# Patient Record
Sex: Female | Born: 1960 | Race: White | Hispanic: No | Marital: Married | Smoking: Former smoker
Health system: Southern US, Community
[De-identification: ages and names within clinical notes are randomized; demographics above are authoritative.]

## PROBLEM LIST (undated history)

## (undated) DIAGNOSIS — I10 Essential (primary) hypertension: Secondary | ICD-10-CM

## (undated) HISTORY — PX: WRIST SURGERY: SHX841

## (undated) HISTORY — PX: DILATION AND CURETTAGE OF UTERUS: SHX78

## (undated) HISTORY — PX: TONSILLECTOMY: SUR1361

---

## 1997-11-21 ENCOUNTER — Other Ambulatory Visit: Admission: RE | Admit: 1997-11-21 | Discharge: 1997-11-21 | Payer: Self-pay | Admitting: Obstetrics and Gynecology

## 1998-02-07 ENCOUNTER — Encounter: Admission: RE | Admit: 1998-02-07 | Discharge: 1998-05-08 | Payer: Self-pay | Admitting: Gynecology

## 1998-06-08 ENCOUNTER — Encounter: Payer: Self-pay | Admitting: Obstetrics and Gynecology

## 1998-06-08 ENCOUNTER — Ambulatory Visit (HOSPITAL_COMMUNITY): Admission: RE | Admit: 1998-06-08 | Discharge: 1998-06-08 | Payer: Self-pay | Admitting: Obstetrics and Gynecology

## 1998-07-03 ENCOUNTER — Inpatient Hospital Stay (HOSPITAL_COMMUNITY): Admission: AD | Admit: 1998-07-03 | Discharge: 1998-07-05 | Payer: Self-pay | Admitting: Gynecology

## 1999-09-15 ENCOUNTER — Emergency Department (HOSPITAL_COMMUNITY): Admission: EM | Admit: 1999-09-15 | Discharge: 1999-09-15 | Payer: Self-pay | Admitting: Emergency Medicine

## 1999-09-15 ENCOUNTER — Encounter: Payer: Self-pay | Admitting: Emergency Medicine

## 1999-09-15 ENCOUNTER — Encounter: Payer: Self-pay | Admitting: Specialist

## 1999-09-18 ENCOUNTER — Encounter: Payer: Self-pay | Admitting: Orthopedic Surgery

## 1999-09-18 ENCOUNTER — Encounter: Admission: RE | Admit: 1999-09-18 | Discharge: 1999-09-18 | Payer: Self-pay | Admitting: Orthopedic Surgery

## 1999-09-24 ENCOUNTER — Encounter: Payer: Self-pay | Admitting: Orthopedic Surgery

## 1999-09-24 ENCOUNTER — Observation Stay (HOSPITAL_COMMUNITY): Admission: RE | Admit: 1999-09-24 | Discharge: 1999-09-25 | Payer: Self-pay | Admitting: Orthopedic Surgery

## 2000-01-30 ENCOUNTER — Encounter: Payer: Self-pay | Admitting: Family Medicine

## 2000-01-30 ENCOUNTER — Encounter: Admission: RE | Admit: 2000-01-30 | Discharge: 2000-01-30 | Payer: Self-pay | Admitting: Family Medicine

## 2002-06-30 ENCOUNTER — Ambulatory Visit (HOSPITAL_COMMUNITY): Admission: RE | Admit: 2002-06-30 | Discharge: 2002-06-30 | Payer: Self-pay | Admitting: *Deleted

## 2002-06-30 ENCOUNTER — Encounter: Payer: Self-pay | Admitting: *Deleted

## 2002-11-03 ENCOUNTER — Encounter: Payer: Self-pay | Admitting: *Deleted

## 2002-11-03 ENCOUNTER — Ambulatory Visit (HOSPITAL_COMMUNITY): Admission: RE | Admit: 2002-11-03 | Discharge: 2002-11-03 | Payer: Self-pay | Admitting: *Deleted

## 2003-08-04 ENCOUNTER — Emergency Department (HOSPITAL_COMMUNITY): Admission: EM | Admit: 2003-08-04 | Discharge: 2003-08-04 | Payer: Self-pay | Admitting: Family Medicine

## 2004-06-01 ENCOUNTER — Encounter: Admission: RE | Admit: 2004-06-01 | Discharge: 2004-06-01 | Payer: Self-pay | Admitting: Family Medicine

## 2004-10-24 ENCOUNTER — Encounter: Admission: RE | Admit: 2004-10-24 | Discharge: 2004-10-24 | Payer: Self-pay | Admitting: *Deleted

## 2007-07-04 ENCOUNTER — Inpatient Hospital Stay: Payer: Self-pay | Admitting: Unknown Physician Specialty

## 2008-08-11 ENCOUNTER — Encounter: Admission: RE | Admit: 2008-08-11 | Discharge: 2008-08-11 | Payer: Self-pay | Admitting: Family Medicine

## 2010-03-16 ENCOUNTER — Encounter: Payer: Self-pay | Admitting: Internal Medicine

## 2010-03-17 ENCOUNTER — Encounter: Payer: Self-pay | Admitting: Family Medicine

## 2010-03-17 ENCOUNTER — Encounter: Payer: Self-pay | Admitting: *Deleted

## 2010-07-12 NOTE — Op Note (Signed)
[facility]  Patient:    Joanna Mcintosh, Joanna Mcintosh                        MRN: [id number] Proc. Date: 09/24/99 Adm. Date:  [id number] Disc. Date: [id number] Attending:  Dominica Severin CC:         Javier Docker, M.D.   Operative Report  DATE OF BIRTH:  02-09-1961  PREOPERATIVE DIAGNOSIS:  Right displaced distal radius fracture with large volar spike protruding into the carpal canal region.  POSTOPERATIVE DIAGNOSIS:  Right displaced distal radius fracture with large volar spike protruding into the carpal canal region.  PROCEDURE:  Open reduction and internal fixation of right distal radius fracture with a titanium volar plate and screws with dorsal blocking pegs.  SURGEON:  Elisha Ponder, M.D.  ASSISTANT:  Ralene Bathe, P.A.  COMPLICATIONS:  None.  ANESTHESIA:  General.  ESTIMATED BLOOD LOSS:  Minimal.  TOURNIQUET TIME:  Less than 90 minutes.  DRAINS:  One small ______ was placed.  COMPLICATIONS:  None.  INDICATIONS FOR PROCEDURE:  The patient is a very pleasant 50 year old white female presenting to the emergency room with a grossly displaced right radius fracture.  She underwent provisional reduction by Javier Docker, M.D.  This was a closed reduction.  The patient did have some volar comminution and a large volar spike was noted.  The patients radius did have some settling as well.  However, the volar tilt had been reduced nicely.  I obtained a CT scan of her wrist, and subsequently counseled her in regards to the findings.  The CT scan had shown some interval collapse and settling of the fracture site with loss of radial inclination and a large protruding volar spike into the carpal canal region.  The patient had no significant signs of median nerve compression at present time, but did initially have some occasional tingling in the immediate post-reduction period.  Her initial degree of displacement was significant given the large  volar spike, settling, and loss radial inclination, I discussed with her the risks and benefits of operative intervention including risks of infection, bleeding, anesthesia, damage to normal structures, failure of the surgery to accomplish its intended goals of relieving symptoms and restoring function.  I have discussed with her that surgery would attempt to recreate the volar buttress and reduce the volar spike.  I discussed with her the possible need for a dorsal incision and bone grafting.  I discussed with her goals of maintaining the radial inclination, radial height, and volar inclination.  We had a long discussion about this and she desired to proceed with surgery.  All risks and benefits were discussed.  INTRAOPERATIVE FINDINGS:  The patient had displaced right distal radius fracture with large volar spike protruding into the carpal canal region.  The patient underwent reduction and placement of a volar plate with derotational pegs from [employer].  The patient had excellent recreation of her anatomy to my satisfaction without difficulty.  She had a stable range of motion under fluoroscopy and no tendency towards dorsal collapse of the fracture site.  The derotational pegs held her fracture position excellently.  DESCRIPTION OF PROCEDURE:  The patient was seen by myself and anesthesia preoperatively.  She was then taken to the operative suite and underwent preoperative antibiotic administration, placed supine, appropriately prepped and draped in the usual sterile fashion after administration of general endotracheal anesthesia.  The right upper extremity was held in traction, prepped  and draped with Betadine scrub, following painting and draping.  Once this was done, outline marks were made for a volar approach to the wrist.  The patient had the tourniquet inflated to 250 mmHg and a longitudinal incision, approximately 6-7 cm long was made over the course of the FCR tendon.   The patient had the FCR sheath exposed.  Cautery was used for hemostasis.  The patient had dissection carried to the level of the superficial radial artery and once this was done, the tendon was retracted after the sheath was incised.  The floor of the FCR tendon sheath was then incised and retractors were placed appropriately.  The patient had the floor of the FCR sheath released to the level of the distal tuberosity of the scaphoid.  Following this, a plane was developed between the FPL and the radial septum to reach the surface of the radius.  The patient had multiple muscular perforators which were cauterized as necessary.  Following this, a plane was developed between the subtendinous space of Parona and the pronator quadratus was exposed.  Following this, the pronator was released with an L-shaped incision and lifted from its bed in a radial-to-ulnar direction.  This allowed exposure of the fracture site.  The patient had the FPL muscle partially released, _______ exposure as necessary and retracted out of harms way.  The patient had the fracture then identified.  There was a large volar spike protruding into the carpal canal-type region.  This spike was gently curettaged and mobilized. Distal to this spike was then main body of her fracture.  The patient underwent meticulous and tedious dissection, and recreation of the anatomy. The volar buttress was restored.  Following restoration of the floor of the radius, provisional fixation was achieved.  A DVR plate from [employer] was then placed.  This was placed volarly and checked under x-ray.  Following this, the patient underwent placement of initial screws in the gliding hole which were 3.5 cortical screws.  This was placed through the gliding hole to allow for adjustment of the volar wrist plate.  The screw was placed without difficulty.  Following this, reduction was confirmed under radiographs, and the patient then had  the distal fourth holes in the plate filled.  This was filled with  three pegs screwed into the distal plate and one 2.5 mm screw.  The plate allowed for nice angulation into the distal fracture fragment, and held in nicely in a reduced position.  There was no dorsal angulation and I was very happy with the overall volar inclination, radial length, and radial inclination.  Pegs and screws were placed in the standard technique of drilling following by depth gauze measurement and then placement.  These engaged the plate nicely.  Following this, the plate had 3.5 cortical screws x 3 placed in the remaining holes.  Once this was done, the patient had the construct checked under x-ray. This was stable.  The patient had excellent range of motion and no tendency towards collapse or displacement.  The patient then had the tourniquet deflated and hemostasis was obtained.  The pronator quadratus was reattached with 2-0 Vicryl to its original position without difficulty.  I was happy with the placement of the pronator quadratus as it covered the plate nicely, and there was no tendency towards impingement against the volar tendinous structures.  Following this, a ____ drain was placed and the wound was then sutured with Prolene in the skin edge.  The patient tolerated this well  without difficulty. She had excellent radial pulse, no excessive bleeding, and excellent capillary refill in all the fingers.  The patient had Xeroform gauze, Kerlix, Webril, and sugar-tong splint applied without difficulty.  Following application, she was extubated and taken to the recovery room in stable condition.  She will be monitored in the recovery room and kept overnight for observation.  I was happy with her reduction.  The pegs were placed below the subchondral plate and held the distal fragment into excellent position.  I do think she will be a good candidate for early range of motion protocol.  I have discussed  all findings with the family. DD:  09/24/99 TD:  09/26/99 Job: [id number]

## 2010-09-17 ENCOUNTER — Other Ambulatory Visit: Payer: Self-pay | Admitting: Obstetrics and Gynecology

## 2010-09-17 ENCOUNTER — Other Ambulatory Visit (HOSPITAL_COMMUNITY)
Admission: RE | Admit: 2010-09-17 | Discharge: 2010-09-17 | Disposition: A | Discharge status: Home / Self Care | Payer: 59 | Source: Ambulatory Visit | Attending: Obstetrics and Gynecology | Admitting: Obstetrics and Gynecology

## 2010-09-17 DIAGNOSIS — Z01419 Encounter for gynecological examination (general) (routine) without abnormal findings: Secondary | ICD-10-CM | POA: Insufficient documentation

## 2011-01-21 ENCOUNTER — Encounter: Payer: Self-pay | Admitting: Emergency Medicine

## 2011-01-21 ENCOUNTER — Emergency Department (HOSPITAL_COMMUNITY): Payer: 59

## 2011-01-21 ENCOUNTER — Emergency Department (HOSPITAL_COMMUNITY)
Admission: EM | Admit: 2011-01-21 | Discharge: 2011-01-21 | Disposition: A | Discharge status: Home / Self Care | Payer: 59 | Attending: Emergency Medicine | Admitting: Emergency Medicine

## 2011-01-21 DIAGNOSIS — R10816 Epigastric abdominal tenderness: Secondary | ICD-10-CM | POA: Insufficient documentation

## 2011-01-21 DIAGNOSIS — R1013 Epigastric pain: Secondary | ICD-10-CM | POA: Insufficient documentation

## 2011-01-21 DIAGNOSIS — R197 Diarrhea, unspecified: Secondary | ICD-10-CM | POA: Insufficient documentation

## 2011-01-21 DIAGNOSIS — R10815 Periumbilic abdominal tenderness: Secondary | ICD-10-CM | POA: Insufficient documentation

## 2011-01-21 DIAGNOSIS — R10813 Right lower quadrant abdominal tenderness: Secondary | ICD-10-CM | POA: Insufficient documentation

## 2011-01-21 DIAGNOSIS — Z87891 Personal history of nicotine dependence: Secondary | ICD-10-CM | POA: Insufficient documentation

## 2011-01-21 DIAGNOSIS — R112 Nausea with vomiting, unspecified: Secondary | ICD-10-CM | POA: Insufficient documentation

## 2011-01-21 DIAGNOSIS — I10 Essential (primary) hypertension: Secondary | ICD-10-CM | POA: Insufficient documentation

## 2011-01-21 DIAGNOSIS — R10811 Right upper quadrant abdominal tenderness: Secondary | ICD-10-CM | POA: Insufficient documentation

## 2011-01-21 HISTORY — DX: Essential (primary) hypertension: I10

## 2011-01-21 LAB — DIFFERENTIAL
Eosinophils Absolute: 0 10*3/uL (ref 0.0–0.7)
Eosinophils Relative: 0 % (ref 0–5)
Lymphs Abs: 0.9 10*3/uL (ref 0.7–4.0)
Monocytes Relative: 2 % — ABNORMAL LOW (ref 3–12)
Neutro Abs: 9.2 10*3/uL — ABNORMAL HIGH (ref 1.7–7.7)
Neutrophils Relative %: 89 % — ABNORMAL HIGH (ref 43–77)

## 2011-01-21 LAB — BASIC METABOLIC PANEL
BUN: 17 mg/dL (ref 6–23)
CO2: 20 mEq/L (ref 19–32)
Calcium: 9.3 mg/dL (ref 8.4–10.5)
GFR calc non Af Amer: >90 mL/min (ref >90)
Glucose, Bld: 125 mg/dL — ABNORMAL HIGH (ref 70–99)
Potassium: 3.6 mEq/L (ref 3.5–5.1)
Sodium: 138 mEq/L (ref 135–145)

## 2011-01-21 LAB — CBC
MCHC: 33.5 g/dL (ref 30.0–36.0)
RDW: 14.1 % (ref 11.5–15.5)

## 2011-01-21 MED ORDER — ONDANSETRON HCL 4 MG PO TABS: 4.0000 mg | ORAL_TABLET | Freq: Four times a day (QID) | ORAL | Status: DC

## 2011-01-21 MED ORDER — DIPHENOXYLATE-ATROPINE 2.5-0.025 MG PO TABS: 2.0000 | ORAL_TABLET | Freq: Once | ORAL | Status: DC

## 2011-01-21 MED ORDER — HYDROMORPHONE HCL PF 1 MG/ML IJ SOLN
1.0000 mg | Freq: Once | INTRAMUSCULAR | Status: AC
  Administered 2011-01-21: 1 mg via INTRAVENOUS
  Filled 2011-01-21: qty 1

## 2011-01-21 MED ORDER — SODIUM CHLORIDE 0.9 % IV BOLUS (SEPSIS)
1000.0000 mL | Freq: Once | INTRAVENOUS | Status: AC
  Administered 2011-01-21: 1000 mL via INTRAVENOUS

## 2011-01-21 MED ORDER — ONDANSETRON HCL 4 MG/2ML IJ SOLN
4.0000 mg | Freq: Once | INTRAMUSCULAR | Status: AC
  Administered 2011-01-21: 4 mg via INTRAVENOUS
  Filled 2011-01-21: qty 2

## 2011-01-21 MED ORDER — HYDROMORPHONE HCL PF 1 MG/ML IJ SOLN
0.5000 mg | Freq: Once | INTRAMUSCULAR | Status: AC
  Administered 2011-01-21: 22:00:00 via INTRAVENOUS
  Filled 2011-01-21: qty 1

## 2011-01-21 MED ORDER — MORPHINE SULFATE 4 MG/ML IJ SOLN
2.0000 mg | Freq: Once | INTRAMUSCULAR | Status: AC
  Administered 2011-01-21: 2 mg via INTRAVENOUS
  Filled 2011-01-21: qty 1

## 2011-01-21 MED ORDER — METOCLOPRAMIDE HCL 5 MG/ML IJ SOLN
INTRAMUSCULAR | Status: AC
  Filled 2011-01-21: qty 2

## 2011-01-21 MED ORDER — HYDROCODONE-ACETAMINOPHEN 5-325 MG PO TABS: 1.0000 | ORAL_TABLET | ORAL | Status: DC | PRN

## 2011-01-21 MED ORDER — PANTOPRAZOLE SODIUM 40 MG IV SOLR
40.0000 mg | Freq: Once | INTRAVENOUS | Status: AC
  Administered 2011-01-21: 40 mg via INTRAVENOUS
  Filled 2011-01-21: qty 40

## 2011-01-21 MED ORDER — METOCLOPRAMIDE HCL 5 MG/ML IJ SOLN
10.0000 mg | Freq: Once | INTRAMUSCULAR | Status: AC
  Administered 2011-01-21: 10 mg via INTRAVENOUS

## 2011-01-21 NOTE — ED Notes (Addendum)
Fourth liter finished, pt states that her pain is beginning to return, Dr. Colon Branch notified, additional  orders given , HR range is 110-116,

## 2011-01-21 NOTE — Discharge Instructions (Signed)
You are scheduled for ultrasound tomorrow at 3:15 PM tomorrow. Be at the radiology department at 2:45 PM. Nothing to eat or drink after 9 AM. Use the pain and nausea medicine as directed.

## 2011-01-21 NOTE — ED Notes (Signed)
Patient c/o right sided chest pain and right sided abd pain. Per patient started today at 1000. She reports becoming diaphoretic. Active nausea and vomiting noted. Patient reports shortness of breath, dizziness, weakness, and diarrhea.

## 2011-01-21 NOTE — ED Provider Notes (Addendum)
History   This chart was scribed for [employer]. Colon Branch, MD by Clarita Crane. The patient was seen in room APA08/APA08 and the patient's care was started at 3:21PM.   CSN: [id number] Arrival date & time: 01/21/2011  3:08 PM   First MD Initiated Contact with Patient 01/21/11 1513      Chief Complaint  Patient presents with  . Chest Pain  . Abdominal Pain    (Consider location/radiation/quality/duration/timing/severity/associated sxs/prior treatment) HPI Joanna Mcintosh is a 50 y.o. female who presents to the Emergency Department complaining of constant moderate to severe right sided and epigastric abdominal pain with sudden onset this morning and persistent since with associated nausea, vomiting (3 episodes), diarrhea (2+ episodes) and diaphoresis. Patient also notes experiencing right sided chest pain several hours following onset of right sided abdominal pain. Denies fever, swelling of lower extremities. Patient notes she is currently on abx which she was prescribed to treat cold sx 1 week ago although she states she has not taken her dose today.   PCP- Holley Bouche  Past Medical History  Diagnosis Date  . Hypertension     Past Surgical History  Procedure Date  . Wrist surgery   . Dilation and curettage of uterus   . Tonsillectomy     Family History  Problem Relation Age of Onset  . Cancer Father   . Cancer Sister     History  Substance Use Topics  . Smoking status: Former Smoker -- 10 years    Types: Cigarettes    Quit date: 01/20/2010  . Smokeless tobacco: Never Used  . Alcohol Use: 1.8 oz/week    3 Cans of beer per week    OB History    Grav Para Term Preterm Abortions TAB SAB Ect Mult Living   3 2 2  1   1  2       Review of Systems 10 Systems reviewed and are negative for acute change except as noted in the HPI.  Allergies  Review of patient's allergies indicates no known allergies.  Home Medications   Current Outpatient Rx  Name Route Sig Dispense  Refill  . IBUPROFEN 200 MG PO TABS Oral Take 600 mg by mouth every 6 (six) hours as needed. Pain     . LOSARTAN POTASSIUM 100 MG PO TABS Oral Take 100 mg by mouth daily.      Marland Kitchen RABEPRAZOLE SODIUM 20 MG PO TBEC Oral Take 20 mg by mouth daily.      Marland Kitchen HYDROCODONE-ACETAMINOPHEN 5-325 MG PO TABS Oral Take 1 tablet by mouth every 4 (four) hours as needed for pain. 15 tablet 0  . ONDANSETRON HCL 4 MG PO TABS Oral Take 1 tablet (4 mg total) by mouth every 6 (six) hours. 12 tablet 0    BP 173/94  Pulse 110  Resp 20  SpO2 100%  LMP 12/30/2010  Physical Exam  Nursing note and vitals reviewed. Constitutional: She is oriented to person, place, and time. She appears well-developed and well-nourished.       Uncomfortable appearing.   HENT:  Head: Normocephalic and atraumatic.  Mouth/Throat: Oropharynx is clear and moist.       TMs nl.   Eyes: EOM are normal. Pupils are equal, round, and reactive to light.  Neck: Neck supple. No tracheal deviation present.  Cardiovascular: Normal rate, regular rhythm and normal heart sounds.  Exam reveals no gallop and no friction rub.   No murmur heard. Pulmonary/Chest: Effort normal and breath sounds normal. No  respiratory distress. She has no wheezes. She has no rales.  Abdominal: Soft. Bowel sounds are normal. She exhibits no distension. There is tenderness in the right upper quadrant, right lower quadrant, epigastric area and periumbilical area.  Musculoskeletal: Normal range of motion. She exhibits no edema.  Neurological: She is alert and oriented to person, place, and time. No sensory deficit.  Skin: Skin is warm and dry.  Psychiatric: She has a normal mood and affect. Her behavior is normal.    ED Course  Procedures (including critical care time)  Date: 01/21/2011  1508  Rate: 93  Rhythm: normal sinus rhythm  QRS Axis: normal  Intervals: normal  ST/T Wave abnormalities: normal  Conduction Disutrbances:none  Narrative Interpretation:   Old EKG  Reviewed: none available  DIAGNOSTIC STUDIES:   COORDINATION OF CARE: 4:45PM- Patient to obtain acute abdominal series and reports she is still experiencing n/v and abdominal pain at this time after administration of Morphine-4mg , Zofran-4mg  and Reglan-10mg /ml.  7:33PM- Nurse informs Nicoletta Dress. Colon Branch, MD that patient requests additional pain medication and fluids PO. 8:30PM- Patient ambulated with aid and heart rate measured 114-116 BPM. Patient will receive fourth liter of fluid-IV at this time. Patient with scheduled [place] for tomorrow 3:15PM.  Labs Reviewed  DIFFERENTIAL - Abnormal; Notable for the following:    Neutrophils Relative 89 (*)    Neutro Abs 9.2 (*)    Lymphocytes Relative 9 (*)    Monocytes Relative 2 (*)    All other components within normal limits  BASIC METABOLIC PANEL - Abnormal; Notable for the following:    Glucose, Bld 125 (*)    All other components within normal limits  CBC   Dg Abd Acute W/chest  01/21/2011  *RADIOLOGY REPORT*  Clinical Data: Vomiting and diarrhea.  ACUTE ABDOMEN SERIES (ABDOMEN 2 VIEW & CHEST 1 VIEW)  Comparison: None  Findings: The upright chest x-ray demonstrates no acute cardiopulmonary findings.  The heart is normal in size and the mediastinal and hilar contours are normal.  Two views of the abdomen demonstrate a paucity of bowel gas.  The soft tissue shadows are maintained.  No findings for obstruction or perforation.  The bony structures are unremarkable.  IMPRESSION:  1.  No acute cardiopulmonary findings. 2.  No plain film findings for an acute abdominal process.  Original Report Authenticated By: P. Loralie Champagne, M.D.     No diagnosis found.    MDM  Patient with right sided abdominal pain associated with nausea, vomiting and diarrhea. Labs were unremarkable, acute abdomen series normal. IVFs, analgesics and antiemetics with improvement. Patient was ambulated with no distress. She has remained tachycardic despite 4 liters of IVF, PO  fluids. She has no c/o of pain, chest pain, dizziness. Patient is returning  At 3:00 PM 01/22/11 for abdominal US to r/o gallbladder. Pt feels improved after observation and/or treatment in ED.The patient appears reasonably screened and/or stabilized for discharge and I doubt any other medical condition or other [facility] requiring further screening, evaluation, or treatment in the ED at this time prior to discharge.  MDM Reviewed: nursing note and vitals Interpretation: labs, ECG and x-ray Total time providing critical care: 40.    I personally performed the services described in this documentation, which was scribed in my presence. The recorded information has been reviewed and considered.        Nicoletta Dress. Colon Branch, MD 01/21/11 2155  Nicoletta Dress. Colon Branch, MD 01/21/11 2156

## 2011-01-21 NOTE — ED Notes (Signed)
Pt ambulatory around nursing desk, tolerated well, hr increased to 117 after getting back into bed

## 2011-01-21 NOTE — ED Notes (Signed)
Pt requesting something else for pain, Dr. Colon Branch notified, additional orders given

## 2011-01-21 NOTE — ED Notes (Signed)
Pt states that her nausea and pain is better, pt and family updated on plan of care

## 2011-01-21 NOTE — ED Notes (Signed)
meds given for nausea, requests something to drink

## 2011-01-21 NOTE — ED Notes (Signed)
Still nauseated, meds ordered for same

## 2011-01-21 NOTE — ED Notes (Signed)
Additional meds given for nausea and pain

## 2011-01-22 ENCOUNTER — Emergency Department (HOSPITAL_COMMUNITY)
Admit: 2011-01-22 | Discharge: 2011-01-22 | Disposition: A | Discharge status: Home / Self Care | Payer: 59 | Attending: Emergency Medicine | Admitting: Emergency Medicine

## 2011-01-22 ENCOUNTER — Encounter (HOSPITAL_COMMUNITY): Payer: Self-pay | Admitting: *Deleted

## 2011-01-22 ENCOUNTER — Emergency Department (HOSPITAL_COMMUNITY)
Admission: EM | Admit: 2011-01-22 | Discharge: 2011-01-22 | Disposition: A | Discharge status: Home / Self Care | Payer: 59 | Attending: Emergency Medicine | Admitting: Emergency Medicine

## 2011-01-22 ENCOUNTER — Emergency Department (HOSPITAL_COMMUNITY): Payer: 59

## 2011-01-22 DIAGNOSIS — I10 Essential (primary) hypertension: Secondary | ICD-10-CM | POA: Insufficient documentation

## 2011-01-22 DIAGNOSIS — Z87891 Personal history of nicotine dependence: Secondary | ICD-10-CM | POA: Insufficient documentation

## 2011-01-22 DIAGNOSIS — K436 Other and unspecified ventral hernia with obstruction, without gangrene: Secondary | ICD-10-CM

## 2011-01-22 LAB — DIFFERENTIAL
Basophils Relative: 0 % (ref 0–1)
Eosinophils Absolute: 0 10*3/uL (ref 0.0–0.7)
Eosinophils Relative: 0 % (ref 0–5)
Lymphs Abs: 1.6 10*3/uL (ref 0.7–4.0)
Monocytes Relative: 6 % (ref 3–12)

## 2011-01-22 LAB — COMPREHENSIVE METABOLIC PANEL
Albumin: 3.5 g/dL (ref 3.5–5.2)
BUN: 12 mg/dL (ref 6–23)
Calcium: 9.5 mg/dL (ref 8.4–10.5)
Creatinine, Ser: 0.56 mg/dL (ref 0.50–1.10)
GFR calc Af Amer: >90 mL/min (ref >90)
Glucose, Bld: 118 mg/dL — ABNORMAL HIGH (ref 70–99)
Total Protein: 6.9 g/dL (ref 6.0–8.3)

## 2011-01-22 LAB — LIPASE, BLOOD: Lipase: 16 U/L (ref 11–59)

## 2011-01-22 LAB — URINALYSIS, ROUTINE W REFLEX MICROSCOPIC
Leukocytes, UA: NEGATIVE
Nitrite: NEGATIVE
Specific Gravity, Urine: >1.03 — ABNORMAL HIGH (ref 1.005–1.030)
pH: 6 (ref 5.0–8.0)

## 2011-01-22 LAB — CBC
Hemoglobin: 12.6 g/dL (ref 12.0–15.0)
MCH: 29.9 pg (ref 26.0–34.0)
MCHC: 33.5 g/dL (ref 30.0–36.0)
MCV: 89.3 fL (ref 78.0–100.0)
RBC: 4.21 MIL/uL (ref 3.87–5.11)

## 2011-01-22 LAB — URINE MICROSCOPIC-ADD ON

## 2011-01-22 LAB — PREGNANCY, URINE: Preg Test, Ur: NEGATIVE

## 2011-01-22 MED ORDER — MORPHINE SULFATE 4 MG/ML IJ SOLN: 2.0000 mg | Freq: Once | INTRAMUSCULAR | Status: DC

## 2011-01-22 MED ORDER — SODIUM CHLORIDE 0.9 % IV SOLN
Freq: Once | INTRAVENOUS | Status: AC
  Administered 2011-01-22: 12:00:00 via INTRAVENOUS

## 2011-01-22 MED ORDER — HYDROMORPHONE HCL PF 1 MG/ML IJ SOLN
1.0000 mg | Freq: Once | INTRAMUSCULAR | Status: AC
  Administered 2011-01-22: 1 mg via INTRAVENOUS
  Filled 2011-01-22: qty 1

## 2011-01-22 MED ORDER — POTASSIUM CHLORIDE ER 10 MEQ PO TBCR: 20.0000 meq | EXTENDED_RELEASE_TABLET | Freq: Two times a day (BID) | ORAL | Status: DC

## 2011-01-22 MED ORDER — IOHEXOL 300 MG/ML  SOLN
100.0000 mL | Freq: Once | INTRAMUSCULAR | Status: AC | PRN
  Administered 2011-01-22: 100 mL via INTRAVENOUS

## 2011-01-22 MED ORDER — ONDANSETRON HCL 4 MG/2ML IJ SOLN
4.0000 mg | Freq: Once | INTRAMUSCULAR | Status: AC
  Administered 2011-01-22: 4 mg via INTRAVENOUS
  Filled 2011-01-22: qty 2

## 2011-01-22 MED ORDER — POTASSIUM CHLORIDE CRYS ER 20 MEQ PO TBCR
20.0000 meq | EXTENDED_RELEASE_TABLET | Freq: Once | ORAL | Status: AC
  Administered 2011-01-22: 20 meq via ORAL
  Filled 2011-01-22: qty 1

## 2011-01-22 NOTE — ED Provider Notes (Addendum)
History  This chart was scribed for [name], MD by Bennett Scrape. This patient was seen in room [facility]01/[facility]01 and the patient's care was started at 11:53AM.  CSN: [id number] Arrival date & time: 01/22/2011 11:17 AM   First MD Initiated Contact with Patient 01/22/11 1150      Chief Complaint  Patient presents with  . Abdominal Pain  . Emesis     Patient is a 50 y.o. female presenting with abdominal pain and vomiting. The history is provided by the patient. No language interpreter was used.  Abdominal Pain The primary symptoms of the illness include abdominal pain, nausea and vomiting. The primary symptoms of the illness do not include fatigue or diarrhea. The current episode started yesterday. The onset of the illness was sudden. The problem has been gradually worsening.  The patient has not had a change in bowel habit. Additional symptoms associated with the illness include chills. Symptoms associated with the illness do not include urgency, hematuria, frequency or back pain.  Emesis  Associated symptoms include abdominal pain and chills. Pertinent negatives include no cough, no diarrhea and no headaches.    Joanna Mcintosh is a 51 y.o. female who presents to the Emergency Department complaining of 2 days of sudden onset, gradually worsening, constant, severe abdominal pain located in the umbilical region with associated abdominal protrusion in the umbilical region, nausea and vomiting. Pt was seen in the ED last night for the same symptoms and was discharged home with an appointment for an ultrasound today. Pt stated that the pain has worsened overnight to the point where she stated that she couldn't wait for the appointment. Pt states that she took Hydrocodone with no improvement in her symptoms. Pt reports that she feels the pain with movement and laying down. Pt denies any modifying factors.Pt has a h/o ectopic pregnancies and HTN.  Past Medical History  Diagnosis Date  .  Hypertension     Past Surgical History  Procedure Date  . Wrist surgery   . Dilation and curettage of uterus   . Tonsillectomy     Family History  Problem Relation Age of Onset  . Cancer Father   . Cancer Sister     History  Substance Use Topics  . Smoking status: Former Smoker -- 10 years    Types: Cigarettes    Quit date: 01/20/2010  . Smokeless tobacco: Never Used  . Alcohol Use: 1.8 oz/week    3 Cans of beer per week    OB History    Grav Para Term Preterm Abortions TAB SAB Ect Mult Living   3 2 2  1   1  2       Review of Systems  Constitutional: Positive for chills. Negative for fatigue.  HENT: Negative for congestion, sinus pressure and ear discharge.   Eyes: Negative for discharge.  Respiratory: Negative for cough.   Cardiovascular: Negative for chest pain.  Gastrointestinal: Positive for nausea, vomiting and abdominal pain. Negative for diarrhea.  Genitourinary: Negative for urgency, frequency and hematuria.  Musculoskeletal: Negative for back pain.  Skin: Negative for rash.  Neurological: Negative for seizures and headaches.  Hematological: Negative.   Psychiatric/Behavioral: Negative for hallucinations.    Allergies  Review of patient's allergies indicates no known allergies.  Home Medications   Current Outpatient Rx  Name Route Sig Dispense Refill  . HYDROCODONE-ACETAMINOPHEN 5-325 MG PO TABS Oral Take 1 tablet by mouth every 4 (four) hours as needed for pain. 15 tablet 0  .  IBUPROFEN 200 MG PO TABS Oral Take 600 mg by mouth every 6 (six) hours as needed. Pain     . LOSARTAN POTASSIUM 100 MG PO TABS Oral Take 100 mg by mouth daily.      Marland Kitchen ONDANSETRON HCL 4 MG PO TABS Oral Take 1 tablet (4 mg total) by mouth every 6 (six) hours. 12 tablet 0  . RABEPRAZOLE SODIUM 20 MG PO TBEC Oral Take 20 mg by mouth daily.        Triage Vitals: BP 195/120  Pulse 120  Temp(Src) 97.4 F (36.3 C) (Oral)  Resp 24  Ht 5\' 8"  (1.727 m)  Wt 175 lb (79.379 kg)   BMI 26.61 kg/m2  SpO2 98%  LMP 12/30/2010  Physical Exam  Nursing note and vitals reviewed. Constitutional: She appears well-developed and well-nourished. No distress.  HENT:  Head: Normocephalic and atraumatic.  Right Ear: External ear normal.  Left Ear: External ear normal.  Eyes: Conjunctivae are normal. Right eye exhibits no discharge. Left eye exhibits no discharge. No scleral icterus.  Neck: Neck supple. No tracheal deviation present.  Cardiovascular: Normal rate, regular rhythm and intact distal pulses.   Pulmonary/Chest: Effort normal and breath sounds normal. No stridor. No respiratory distress. She has no wheezes. She has no rales.  Abdominal: Soft. Bowel sounds are normal. She exhibits no distension. There is tenderness (moderate tenderness in the umbilical region, umbicial hernia tenderness ). There is no rebound and no guarding.  Musculoskeletal: She exhibits no edema and no tenderness.  Neurological: She is alert. She has normal strength. No sensory deficit. Cranial nerve deficit:  no gross defecits noted. She exhibits normal muscle tone. She displays no seizure activity. Coordination normal.  Skin: Skin is warm and dry. No rash noted.  Psychiatric: She has a normal mood and affect.    ED Course  Procedures (including critical care time)  DIAGNOSTIC STUDIES: Oxygen Saturation is 98% on room air, normal by my interpretation.    COORDINATION OF CARE: 12:00PM-Discussed treatment plan with patient at bedside and patient agreed to plan. 2:49PM-Pt rechecked and states that the pain medication has improved her pain. She states that her pain waxes and wanes every 30 minutes. All labs and x-rays reviewed with patient and treatment plan discussed. Pt agreed to plan.  Results for orders placed during the hospital encounter of 01/22/11  CBC      Component Value Range   WBC 14.0 (*) 4.0 - 10.5 (K/uL)   RBC 4.21  3.87 - 5.11 (MIL/uL)   Hemoglobin 12.6  12.0 - 15.0 (g/dL)   HCT  16.1  09.6 - 04.5 (%)   MCV 89.3  78.0 - 100.0 (fL)   MCH 29.9  26.0 - 34.0 (pg)   MCHC 33.5  30.0 - 36.0 (g/dL)   RDW 40.9  81.1 - 91.4 (%)   Platelets 343  150 - 400 (K/uL)  DIFFERENTIAL      Component Value Range   Neutrophils Relative 83 (*) 43 - 77 (%)   Neutro Abs 11.6 (*) 1.7 - 7.7 (K/uL)   Lymphocytes Relative 12  12 - 46 (%)   Lymphs Abs 1.6  0.7 - 4.0 (K/uL)   Monocytes Relative 6  3 - 12 (%)   Monocytes Absolute 0.8  0.1 - 1.0 (K/uL)   Eosinophils Relative 0  0 - 5 (%)   Eosinophils Absolute 0.0  0.0 - 0.7 (K/uL)   Basophils Relative 0  0 - 1 (%)   Basophils Absolute 0.0  0.0 - 0.1 (K/uL)  COMPREHENSIVE METABOLIC PANEL      Component Value Range   Sodium 138  135 - 145 (mEq/L)   Potassium 3.2 (*) 3.5 - 5.1 (mEq/L)   Chloride 103  96 - 112 (mEq/L)   CO2 22  19 - 32 (mEq/L)   Glucose, Bld 118 (*) 70 - 99 (mg/dL)   BUN 12  6 - 23 (mg/dL)   Creatinine, Ser 8.65  0.50 - 1.10 (mg/dL)   Calcium 9.5  8.4 - 78.4 (mg/dL)   Total Protein 6.9  6.0 - 8.3 (g/dL)   Albumin 3.5  3.5 - 5.2 (g/dL)   AST 16  0 - 37 (U/L)   ALT 17  0 - 35 (U/L)   Alkaline Phosphatase 79  39 - 117 (U/L)   Total Bilirubin 1.1  0.3 - 1.2 (mg/dL)   GFR calc non Af Amer >90  >90 (mL/min)   GFR calc Af Amer >90  >90 (mL/min)  URINALYSIS, ROUTINE W REFLEX MICROSCOPIC      Component Value Range   Color, Urine YELLOW  YELLOW    APPearance CLOUDY (*) CLEAR    Specific Gravity, Urine >1.030 (*) 1.005 - 1.030    pH 6.0  5.0 - 8.0    Glucose, UA NEGATIVE  NEGATIVE (mg/dL)   Hgb urine dipstick LARGE (*) NEGATIVE    Bilirubin Urine NEGATIVE  NEGATIVE    Ketones, ur TRACE (*) NEGATIVE (mg/dL)   Protein, ur 30 (*) NEGATIVE (mg/dL)   Urobilinogen, UA 0.2  0.0 - 1.0 (mg/dL)   Nitrite NEGATIVE  NEGATIVE    Leukocytes, UA NEGATIVE  NEGATIVE   PREGNANCY, URINE      Component Value Range   Preg Test, Ur NEGATIVE    LIPASE, BLOOD      Component Value Range   Lipase 16  11 - 59 (U/L)  URINE MICROSCOPIC-ADD ON        Component Value Range   Squamous Epithelial / LPF MANY (*) RARE    WBC, UA 0-2  <3 (WBC/hpf)   RBC / HPF 3-6  <3 (RBC/hpf)   Bacteria, UA MANY (*) RARE    Ct Abdomen Pelvis W Contrast  01/22/2011  *RADIOLOGY REPORT*  Clinical Data: Abdominal pain, vomiting  CT ABDOMEN AND PELVIS WITH CONTRAST  Technique:  Multidetector CT imaging of the abdomen and pelvis was performed following the standard protocol during bolus administration of intravenous contrast. Sagittal and coronal MPR images reconstructed from axial data set.  Contrast: OMNIPAQUE IOHEXOL 300 MG/ML IV SOLN Dilute oral contrast.  Comparison: None  Findings: Lung bases clear. Liver, spleen, pancreas, kidneys, and adrenal glands normal appearance. Small amount of free intraperitoneal fluid perihepatic, at the gutters, and in the pelvis. High-grade small bowel obstruction secondary to the an umbilical hernia. Small bowel is dilated proximal to the hernia sac and decompressed distally. No definite bowel wall thickening or free intraperitoneal air identified. No definite mass, adenopathy or free fluid. Mild diffuse edema seen within mesentery compatible with small bowel obstruction. Distended gallbladder with fold near fundus. Stomach appears distended. No focal colonic abnormalities. Bladder, uterus adnexae, and appendix normal appearance. Osseous demineralization.  IMPRESSION: High-grade small bowel obstruction secondary to an umbilical hernia containing a mid small bowel loop. Scattered ascites and mild mesenteric edema. No evidence of bowel perforation or definite wall thickening.  Findings called to Dr. Estell Harpin on 01/22/2011 at 1445 hours.  Original Report Authenticated By: Lollie Marrow, M.D.   Dg Abd  Acute W/chest  01/21/2011  *RADIOLOGY REPORT*  Clinical Data: Vomiting and diarrhea.  ACUTE ABDOMEN SERIES (ABDOMEN 2 VIEW & CHEST 1 VIEW)  Comparison: None  Findings: The upright chest x-ray demonstrates no acute cardiopulmonary  findings.  The heart is normal in size and the mediastinal and hilar contours are normal.  Two views of the abdomen demonstrate a paucity of bowel gas.  The soft tissue shadows are maintained.  No findings for obstruction or perforation.  The bony structures are unremarkable.  IMPRESSION:  1.  No acute cardiopulmonary findings. 2.  No plain film findings for an acute abdominal process.  Original Report Authenticated By: P. Loralie Champagne, M.D.     1. Incarcerated epigastric hernia       MDM   The chart was scribed for me under my direct supervision.  I personally performed the history, physical, and medical decision making and all procedures in the evaluation of this patient..     Dr. Lovell Sheehan reduced hernia and will fix hernia as an out pt  [name], MD 01/22/11 1546  [name], MD 01/22/11 [phone]

## 2011-01-22 NOTE — H&P (Signed)
Joanna Mcintosh is an 50 y.o. female.   Chief Complaint: Umbilical hernia HPI: Patient is a 50 year old white female presented emergency room on 01/22/2011 with an incarcerated umbilical hernia. After pain medication was administered, the hernia was reduced in the emergency room. She now presents for an elective umbilical herniorrhaphy with mesh.  Past Medical History  Diagnosis Date  . Hypertension     Past Surgical History  Procedure Date  . Wrist surgery   . Dilation and curettage of uterus   . Tonsillectomy     Family History  Problem Relation Age of Onset  . Cancer Father   . Cancer Sister    Social History:  reports that she quit smoking about a year ago. Her smoking use included Cigarettes. She quit after 10 years of use. She has never used smokeless tobacco. She reports that she drinks about 1.8 ounces of alcohol per week. She reports that she does not use illicit drugs.  Allergies: No Known Allergies  Medications Prior to Admission  Medication Dose Route Frequency Provider Last Rate Last Dose  . 0.9 %  sodium chloride infusion   Intravenous Once [name], MD 125 mL/hr at 01/22/11 1203    . HYDROmorphone (DILAUDID) injection 0.5 mg  0.5 mg Intravenous Once [employer]. Colon Branch, MD      . HYDROmorphone (DILAUDID) injection 1 mg  1 mg Intravenous Once Nicoletta Dress. Colon Branch, MD   1 mg at 01/21/11 1729  . HYDROmorphone (DILAUDID) injection 1 mg  1 mg Intravenous Once [employer]. Colon Branch, MD   1 mg at 01/21/11 2013  . HYDROmorphone (DILAUDID) injection 1 mg  1 mg Intravenous Once [name], MD   1 mg at 01/22/11 1202  . HYDROmorphone (DILAUDID) injection 1 mg  1 mg Intravenous Once [name], MD   1 mg at 01/22/11 1400  . HYDROmorphone (DILAUDID) injection 1 mg  1 mg Intravenous Once [name], MD   1 mg at 01/22/11 1525  . iohexol (OMNIPAQUE) 300 MG/ML injection 100 mL  100 mL Intravenous Once PRN Medication Radiologist   100 mL at 01/22/11 1432  . metoCLOPramide  (REGLAN) injection 10 mg  10 mg Intravenous Once [employer]. Colon Branch, MD   10 mg at 01/21/11 1632  . morphine 4 MG/ML injection 2 mg  2 mg Intravenous Once [employer]. Colon Branch, MD   2 mg at 01/21/11 1557  . ondansetron (ZOFRAN) injection 4 mg  4 mg Intravenous Once [employer]. Colon Branch, MD   4 mg at 01/21/11 1555  . ondansetron (ZOFRAN) injection 4 mg  4 mg Intravenous Once [employer]. Colon Branch, MD   4 mg at 01/21/11 2012  . ondansetron (ZOFRAN) injection 4 mg  4 mg Intravenous Once [name], MD   4 mg at 01/22/11 1203  . ondansetron (ZOFRAN) injection 4 mg  4 mg Intravenous Once [name], MD   4 mg at 01/22/11 1526  . pantoprazole (PROTONIX) injection 40 mg  40 mg Intravenous Once [employer]. Colon Branch, MD   40 mg at 01/21/11 1727  . sodium chloride 0.9 % bolus 1,000 mL  1,000 mL Intravenous Once [employer]. Colon Branch, MD   1,000 mL at 01/21/11 1555  . sodium chloride 0.9 % bolus 1,000 mL  1,000 mL Intravenous Once [employer]. Colon Branch, MD   1,000 mL at 01/21/11 1630  . sodium chloride 0.9 % bolus 1,000 mL  1,000 mL Intravenous Once [employer]. Colon Branch, MD  1,000 mL at 01/21/11 1843  . sodium chloride 0.9 % bolus 1,000 mL  1,000 mL Intravenous Once [employer]. Colon Branch, MD   1,000 mL at 01/21/11 2012  . sodium chloride 0.9 % bolus 1,000 mL  1,000 mL Intravenous Once [employer]. Colon Branch, MD   1,000 mL at 01/21/11 2104  . DISCONTD: diphenoxylate-atropine (LOMOTIL) 2.5-0.025 MG per tablet 2 tablet  2 tablet Oral Once [employer]. Colon Branch, MD      . DISCONTD: metoCLOPramide (REGLAN) 5 MG/ML injection           . DISCONTD: morphine 4 MG/ML injection 2 mg  2 mg Intravenous Once [name], MD       Medications Prior to Admission  Medication Sig Dispense Refill  . HYDROcodone-acetaminophen (NORCO) 5-325 MG per tablet Take 1 tablet by mouth every 4 (four) hours as needed for pain.  15 tablet  0  . ibuprofen (ADVIL,MOTRIN) 200 MG tablet Take 600 mg by mouth every 6 (six) hours as needed. Pain       . losartan (COZAAR) 100 MG tablet Take  100 mg by mouth daily.        . ondansetron (ZOFRAN) 4 MG tablet Take 1 tablet (4 mg total) by mouth every 6 (six) hours.  12 tablet  0  . RABEprazole (ACIPHEX) 20 MG tablet Take 20 mg by mouth daily.          Results for orders placed during the hospital encounter of 01/22/11 (from the past 48 hour(s))  CBC     Status: Abnormal   Collection Time   01/22/11 12:06 PM      Component Value Range Comment   WBC 14.0 (*) 4.0 - 10.5 (K/uL)    RBC 4.21  3.87 - 5.11 (MIL/uL)    Hemoglobin 12.6  12.0 - 15.0 (g/dL)    HCT 52.8  41.3 - 24.4 (%)    MCV 89.3  78.0 - 100.0 (fL)    MCH 29.9  26.0 - 34.0 (pg)    MCHC 33.5  30.0 - 36.0 (g/dL)    RDW 01.0  27.2 - 53.6 (%)    Platelets 343  150 - 400 (K/uL)   DIFFERENTIAL     Status: Abnormal   Collection Time   01/22/11 12:06 PM      Component Value Range Comment   Neutrophils Relative 83 (*) 43 - 77 (%)    Neutro Abs 11.6 (*) 1.7 - 7.7 (K/uL)    Lymphocytes Relative 12  12 - 46 (%)    Lymphs Abs 1.6  0.7 - 4.0 (K/uL)    Monocytes Relative 6  3 - 12 (%)    Monocytes Absolute 0.8  0.1 - 1.0 (K/uL)    Eosinophils Relative 0  0 - 5 (%)    Eosinophils Absolute 0.0  0.0 - 0.7 (K/uL)    Basophils Relative 0  0 - 1 (%)    Basophils Absolute 0.0  0.0 - 0.1 (K/uL)   COMPREHENSIVE METABOLIC PANEL     Status: Abnormal   Collection Time   01/22/11 12:06 PM      Component Value Range Comment   Sodium 138  135 - 145 (mEq/L)    Potassium 3.2 (*) 3.5 - 5.1 (mEq/L)    Chloride 103  96 - 112 (mEq/L)    CO2 22  19 - 32 (mEq/L)    Glucose, Bld 118 (*) 70 - 99 (mg/dL)    BUN 12  6 - 23 (mg/dL)  Creatinine, Ser 0.56  0.50 - 1.10 (mg/dL)    Calcium 9.5  8.4 - 10.5 (mg/dL)    Total Protein 6.9  6.0 - 8.3 (g/dL)    Albumin 3.5  3.5 - 5.2 (g/dL)    AST 16  0 - 37 (U/L)    ALT 17  0 - 35 (U/L)    Alkaline Phosphatase 79  39 - 117 (U/L)    Total Bilirubin 1.1  0.3 - 1.2 (mg/dL)    GFR calc non Af Amer >90  >90 (mL/min)    GFR calc Af Amer >90  >90 (mL/min)     LIPASE, BLOOD     Status: Normal   Collection Time   01/22/11 12:06 PM      Component Value Range Comment   Lipase 16  11 - 59 (U/L)   URINALYSIS, ROUTINE W REFLEX MICROSCOPIC     Status: Abnormal   Collection Time   01/22/11  1:42 PM      Component Value Range Comment   Color, Urine YELLOW  YELLOW     APPearance CLOUDY (*) CLEAR     Specific Gravity, Urine >1.030 (*) 1.005 - 1.030     pH 6.0  5.0 - 8.0     Glucose, UA NEGATIVE  NEGATIVE (mg/dL)    Hgb urine dipstick LARGE (*) NEGATIVE     Bilirubin Urine NEGATIVE  NEGATIVE     Ketones, ur TRACE (*) NEGATIVE (mg/dL)    Protein, ur 30 (*) NEGATIVE (mg/dL)    Urobilinogen, UA 0.2  0.0 - 1.0 (mg/dL)    Nitrite NEGATIVE  NEGATIVE     Leukocytes, UA NEGATIVE  NEGATIVE    PREGNANCY, URINE     Status: Normal   Collection Time   01/22/11  1:42 PM      Component Value Range Comment   Preg Test, Ur NEGATIVE     URINE MICROSCOPIC-ADD ON     Status: Abnormal   Collection Time   01/22/11  1:42 PM      Component Value Range Comment   Squamous Epithelial / LPF MANY (*) RARE     WBC, UA 0-2  <3 (WBC/hpf)    RBC / HPF 3-6  <3 (RBC/hpf)    Bacteria, UA MANY (*) RARE     Ct Abdomen Pelvis W Contrast  01/22/2011  *RADIOLOGY REPORT*  Clinical Data: Abdominal pain, vomiting  CT ABDOMEN AND PELVIS WITH CONTRAST  Technique:  Multidetector CT imaging of the abdomen and pelvis was performed following the standard protocol during bolus administration of intravenous contrast. Sagittal and coronal MPR images reconstructed from axial data set.  Contrast: OMNIPAQUE IOHEXOL 300 MG/ML IV SOLN Dilute oral contrast.  Comparison: None  Findings: Lung bases clear. Liver, spleen, pancreas, kidneys, and adrenal glands normal appearance. Small amount of free intraperitoneal fluid perihepatic, at the gutters, and in the pelvis. High-grade small bowel obstruction secondary to the an umbilical hernia. Small bowel is dilated proximal to the hernia sac and  decompressed distally. No definite bowel wall thickening or free intraperitoneal air identified. No definite mass, adenopathy or free fluid. Mild diffuse edema seen within mesentery compatible with small bowel obstruction. Distended gallbladder with fold near fundus. Stomach appears distended. No focal colonic abnormalities. Bladder, uterus adnexae, and appendix normal appearance. Osseous demineralization.  IMPRESSION: High-grade small bowel obstruction secondary to an umbilical hernia containing a mid small bowel loop. Scattered ascites and mild mesenteric edema. No evidence of bowel perforation or definite wall thickening.  Findings called to Dr. Estell Harpin on 01/22/2011 at 1445 hours.  Original Report Authenticated By: Lollie Marrow, M.D.   Dg Abd Acute W/chest  01/21/2011  *RADIOLOGY REPORT*  Clinical Data: Vomiting and diarrhea.  ACUTE ABDOMEN SERIES (ABDOMEN 2 VIEW & CHEST 1 VIEW)  Comparison: None  Findings: The upright chest x-ray demonstrates no acute cardiopulmonary findings.  The heart is normal in size and the mediastinal and hilar contours are normal.  Two views of the abdomen demonstrate a paucity of bowel gas.  The soft tissue shadows are maintained.  No findings for obstruction or perforation.  The bony structures are unremarkable.  IMPRESSION:  1.  No acute cardiopulmonary findings. 2.  No plain film findings for an acute abdominal process.  Original Report Authenticated By: P. Loralie Champagne, M.D.    Review of Systems  Constitutional: Positive for chills.  HENT: Negative.   Eyes: Negative.   Respiratory: Negative.   Cardiovascular: Negative.   Gastrointestinal: Positive for nausea, vomiting and abdominal pain.  Genitourinary: Negative.   Musculoskeletal: Negative.   Skin: Negative.   Neurological: Negative.   Endo/Heme/Allergies: Negative.   Psychiatric/Behavioral: Negative.     Blood pressure 160/98, pulse 114, temperature 97.4 F (36.3 C), temperature source Oral, resp. rate  20, height 5\' 8"  (1.727 m), weight 79.379 kg (175 lb), last menstrual period 12/30/2010, SpO2 97.00%. Physical Exam  Constitutional: She is oriented to person, place, and time. She appears well-developed and well-nourished.  HENT:  Head: Normocephalic.  Neck: Normal range of motion. Neck supple.  Cardiovascular: Normal rate, regular rhythm and normal heart sounds.   Respiratory: Effort normal and breath sounds normal.  GI: Soft. Bowel sounds are normal. She exhibits no distension. There is tenderness.       Umbilical hernia reduced in ER.  Neurological: She is alert and oriented to person, place, and time.  Skin: Skin is warm and dry.     Assessment/Plan Impression: Umbilical hernia Plan: Patient is scheduled undergo an umbilical herniorrhaphy with mesh on 01/23/2011. Risks and benefits of the procedure including bleeding, infection, and a possibly recurrence of the hernia were fully explained to the patient, gave informed consent. She was noted to have a low potassium and she'll be given potassium supplementation prior to surgery.  Vashawn Ekstein A 01/22/2011, 4:01 PM

## 2011-01-22 NOTE — ED Notes (Signed)
Pt c/o severe abd pain with vomiting. Pt states she was seen in ed last pm d/t same sx. Pt states she has not improved but gotten worse.

## 2011-01-22 NOTE — ED Notes (Signed)
Pt refused to go to CT until she received something for pain; pt given something for pain; Pt is very upset stating she wants to know what is wrong with her; pt states multiple times that she was here for 8-9 hrs yesterday and she should know what is wrong with her; pt is complaining about how uncomfortable the bed is, I informed pt that we had no more beds available and that I was sorry it was not more comfortable; pt has now gone over to CT

## 2011-01-23 ENCOUNTER — Ambulatory Visit (HOSPITAL_COMMUNITY): Payer: 59 | Admitting: Anesthesiology

## 2011-01-23 ENCOUNTER — Encounter (HOSPITAL_COMMUNITY): Payer: Self-pay | Admitting: Anesthesiology

## 2011-01-23 ENCOUNTER — Ambulatory Visit (HOSPITAL_COMMUNITY)
Admission: RE | Admit: 2011-01-23 | Discharge: 2011-01-23 | Disposition: A | Discharge status: Home / Self Care | Payer: 59 | Source: Ambulatory Visit | Attending: General Surgery | Admitting: General Surgery

## 2011-01-23 ENCOUNTER — Encounter (HOSPITAL_COMMUNITY)
Admission: RE | Disposition: A | Discharge status: Home / Self Care | Payer: Self-pay | Source: Ambulatory Visit | Attending: General Surgery

## 2011-01-23 ENCOUNTER — Ambulatory Visit (HOSPITAL_COMMUNITY): Admission: RE | Admit: 2011-01-23 | Payer: 59 | Source: Ambulatory Visit | Admitting: General Surgery

## 2011-01-23 ENCOUNTER — Encounter (HOSPITAL_COMMUNITY): Payer: Self-pay | Admitting: *Deleted

## 2011-01-23 DIAGNOSIS — I10 Essential (primary) hypertension: Secondary | ICD-10-CM | POA: Insufficient documentation

## 2011-01-23 DIAGNOSIS — Z01812 Encounter for preprocedural laboratory examination: Secondary | ICD-10-CM | POA: Insufficient documentation

## 2011-01-23 DIAGNOSIS — Z79899 Other long term (current) drug therapy: Secondary | ICD-10-CM | POA: Insufficient documentation

## 2011-01-23 DIAGNOSIS — K429 Umbilical hernia without obstruction or gangrene: Secondary | ICD-10-CM | POA: Insufficient documentation

## 2011-01-23 HISTORY — PX: UMBILICAL HERNIA REPAIR: SHX196

## 2011-01-23 LAB — POCT I-STAT 4, (NA,K, GLUC, HGB,HCT)
Glucose, Bld: 86 mg/dL (ref 70–99)
HCT: 39 % (ref 36.0–46.0)
Hemoglobin: 13.3 g/dL (ref 12.0–15.0)
Sodium: 141 mEq/L (ref 135–145)

## 2011-01-23 LAB — SURGICAL PCR SCREEN
MRSA, PCR: NEGATIVE
Staphylococcus aureus: NEGATIVE

## 2011-01-23 SURGERY — REPAIR, HERNIA, UMBILICAL, ADULT
Anesthesia: General

## 2011-01-23 SURGERY — REPAIR, HERNIA, UMBILICAL, ADULT
Anesthesia: General | Site: Abdomen | Wound class: Clean

## 2011-01-23 MED ORDER — ENOXAPARIN SODIUM 40 MG/0.4ML ~~LOC~~ SOLN
SUBCUTANEOUS | Status: AC
  Administered 2011-01-23: 40 mg via SUBCUTANEOUS
  Filled 2011-01-23: qty 0.4

## 2011-01-23 MED ORDER — ROCURONIUM BROMIDE 50 MG/5ML IV SOLN
INTRAVENOUS | Status: AC
  Filled 2011-01-23: qty 1

## 2011-01-23 MED ORDER — LABETALOL HCL 5 MG/ML IV SOLN
INTRAVENOUS | Status: AC
  Filled 2011-01-23: qty 4

## 2011-01-23 MED ORDER — MUPIROCIN 2 % EX OINT
TOPICAL_OINTMENT | CUTANEOUS | Status: AC
  Administered 2011-01-23: 1 via NASAL
  Filled 2011-01-23: qty 22

## 2011-01-23 MED ORDER — HYDROCODONE-ACETAMINOPHEN 5-325 MG PO TABS: 1.0000 | ORAL_TABLET | ORAL | Status: AC | PRN

## 2011-01-23 MED ORDER — SODIUM CHLORIDE 0.9 % IR SOLN
Status: DC | PRN
  Administered 2011-01-23: 1000 mL

## 2011-01-23 MED ORDER — LABETALOL HCL 5 MG/ML IV SOLN
10.0000 mg | INTRAVENOUS | Status: DC | PRN
  Administered 2011-01-23 (×2): 10 mg via INTRAVENOUS

## 2011-01-23 MED ORDER — BUPIVACAINE HCL (PF) 0.5 % IJ SOLN
INTRAMUSCULAR | Status: AC
  Filled 2011-01-23: qty 30

## 2011-01-23 MED ORDER — PROPOFOL 10 MG/ML IV EMUL
INTRAVENOUS | Status: AC
  Filled 2011-01-23: qty 20

## 2011-01-23 MED ORDER — FENTANYL CITRATE 0.05 MG/ML IJ SOLN
INTRAMUSCULAR | Status: AC
  Filled 2011-01-23: qty 2

## 2011-01-23 MED ORDER — KETOROLAC TROMETHAMINE 30 MG/ML IJ SOLN
30.0000 mg | Freq: Once | INTRAMUSCULAR | Status: AC
  Administered 2011-01-23: 30 mg via INTRAVENOUS

## 2011-01-23 MED ORDER — PROPOFOL 10 MG/ML IV BOLUS
INTRAVENOUS | Status: DC | PRN
  Administered 2011-01-23: 150 mg via INTRAVENOUS

## 2011-01-23 MED ORDER — MUPIROCIN 2 % EX OINT
TOPICAL_OINTMENT | Freq: Two times a day (BID) | CUTANEOUS | Status: DC
  Administered 2011-01-23: 1 via NASAL

## 2011-01-23 MED ORDER — ONDANSETRON HCL 4 MG/2ML IJ SOLN
4.0000 mg | Freq: Once | INTRAMUSCULAR | Status: AC
  Administered 2011-01-23: 4 mg via INTRAVENOUS

## 2011-01-23 MED ORDER — ONDANSETRON HCL 4 MG/2ML IJ SOLN: 4.0000 mg | Freq: Once | INTRAMUSCULAR | Status: DC | PRN

## 2011-01-23 MED ORDER — FENTANYL CITRATE 0.05 MG/ML IJ SOLN: 25.0000 ug | INTRAMUSCULAR | Status: DC | PRN

## 2011-01-23 MED ORDER — ONDANSETRON HCL 4 MG/2ML IJ SOLN
INTRAMUSCULAR | Status: AC
  Administered 2011-01-23: 4 mg via INTRAVENOUS
  Filled 2011-01-23: qty 2

## 2011-01-23 MED ORDER — BUPIVACAINE HCL (PF) 0.5 % IJ SOLN
INTRAMUSCULAR | Status: DC | PRN
  Administered 2011-01-23: 10 mL

## 2011-01-23 MED ORDER — GLYCOPYRROLATE 0.2 MG/ML IJ SOLN
INTRAMUSCULAR | Status: AC
  Filled 2011-01-23: qty 1

## 2011-01-23 MED ORDER — POTASSIUM CHLORIDE ER 10 MEQ PO TBCR: 20.0000 meq | EXTENDED_RELEASE_TABLET | Freq: Two times a day (BID) | ORAL | Status: DC

## 2011-01-23 MED ORDER — ROCURONIUM BROMIDE 100 MG/10ML IV SOLN
INTRAVENOUS | Status: DC | PRN
  Administered 2011-01-23: 30 mg via INTRAVENOUS

## 2011-01-23 MED ORDER — NEOSTIGMINE METHYLSULFATE 1 MG/ML IJ SOLN
INTRAMUSCULAR | Status: AC
  Filled 2011-01-23: qty 10

## 2011-01-23 MED ORDER — GLYCOPYRROLATE 0.2 MG/ML IJ SOLN
INTRAMUSCULAR | Status: DC | PRN
  Administered 2011-01-23: .4 mg via INTRAVENOUS

## 2011-01-23 MED ORDER — FENTANYL CITRATE 0.05 MG/ML IJ SOLN
INTRAMUSCULAR | Status: AC
  Filled 2011-01-23: qty 5

## 2011-01-23 MED ORDER — MIDAZOLAM HCL 2 MG/2ML IJ SOLN
1.0000 mg | INTRAMUSCULAR | Status: DC | PRN
  Administered 2011-01-23 (×2): 2 mg via INTRAVENOUS

## 2011-01-23 MED ORDER — LIDOCAINE HCL (PF) 1 % IJ SOLN
INTRAMUSCULAR | Status: AC
  Filled 2011-01-23: qty 5

## 2011-01-23 MED ORDER — FENTANYL CITRATE 0.05 MG/ML IJ SOLN
INTRAMUSCULAR | Status: DC | PRN
  Administered 2011-01-23 (×5): 50 ug via INTRAVENOUS

## 2011-01-23 MED ORDER — KETOROLAC TROMETHAMINE 30 MG/ML IJ SOLN
INTRAMUSCULAR | Status: AC
  Filled 2011-01-23: qty 1

## 2011-01-23 MED ORDER — ENOXAPARIN SODIUM 40 MG/0.4ML ~~LOC~~ SOLN
40.0000 mg | Freq: Once | SUBCUTANEOUS | Status: AC
  Administered 2011-01-23: 40 mg via SUBCUTANEOUS

## 2011-01-23 MED ORDER — CEFAZOLIN SODIUM-DEXTROSE 2-3 GM-% IV SOLR: 2.0000 g | INTRAVENOUS | Status: DC

## 2011-01-23 MED ORDER — CEFAZOLIN SODIUM-DEXTROSE 2-3 GM-% IV SOLR
INTRAVENOUS | Status: AC
  Filled 2011-01-23: qty 50

## 2011-01-23 MED ORDER — CEFAZOLIN SODIUM 1-5 GM-% IV SOLN
INTRAVENOUS | Status: DC | PRN
  Administered 2011-01-23: 2 g via INTRAVENOUS

## 2011-01-23 MED ORDER — LACTATED RINGERS IV SOLN
INTRAVENOUS | Status: DC
  Administered 2011-01-23: 1000 mL via INTRAVENOUS

## 2011-01-23 MED ORDER — LIDOCAINE HCL (CARDIAC) 20 MG/ML IV SOLN
INTRAVENOUS | Status: DC | PRN
  Administered 2011-01-23: 50 mg via INTRAVENOUS

## 2011-01-23 MED ORDER — MIDAZOLAM HCL 2 MG/2ML IJ SOLN
INTRAMUSCULAR | Status: AC
  Administered 2011-01-23: 2 mg via INTRAVENOUS
  Filled 2011-01-23: qty 2

## 2011-01-23 MED ORDER — NEOSTIGMINE METHYLSULFATE 1 MG/ML IJ SOLN
INTRAMUSCULAR | Status: DC | PRN
  Administered 2011-01-23: 3 mg via INTRAVENOUS

## 2011-01-23 MED ORDER — MIDAZOLAM HCL 2 MG/2ML IJ SOLN
INTRAMUSCULAR | Status: AC
  Filled 2011-01-23: qty 2

## 2011-01-23 MED ORDER — ONDANSETRON HCL 4 MG PO TABS: 4.0000 mg | ORAL_TABLET | Freq: Four times a day (QID) | ORAL | Status: AC

## 2011-01-23 MED ORDER — KETOROLAC TROMETHAMINE 30 MG/ML IJ SOLN
INTRAMUSCULAR | Status: AC
  Administered 2011-01-23: 30 mg via INTRAVENOUS
  Filled 2011-01-23: qty 1

## 2011-01-23 MED ORDER — POVIDONE-IODINE 10 % EX OINT
TOPICAL_OINTMENT | CUTANEOUS | Status: AC
  Filled 2011-01-23: qty 1

## 2011-01-23 SURGICAL SUPPLY — 39 items
BAG HAMPER (MISCELLANEOUS) ×2 IMPLANT
BLADE SURG SZ11 CARB STEEL (BLADE) ×2 IMPLANT
CLOTH BEACON ORANGE TIMEOUT ST (SAFETY) ×2 IMPLANT
COVER LIGHT HANDLE STERIS (MISCELLANEOUS) ×4 IMPLANT
DECANTER SPIKE VIAL GLASS SM (MISCELLANEOUS) ×2 IMPLANT
DERMABOND ADVANCED (GAUZE/BANDAGES/DRESSINGS)
DERMABOND ADVANCED .7 DNX12 (GAUZE/BANDAGES/DRESSINGS) IMPLANT
DURAPREP 26ML APPLICATOR (WOUND CARE) ×2 IMPLANT
ELECT REM PT RETURN 9FT ADLT (ELECTROSURGICAL) ×2
ELECTRODE REM PT RTRN 9FT ADLT (ELECTROSURGICAL) ×1 IMPLANT
FORMALIN 10 PREFIL 120ML (MISCELLANEOUS) IMPLANT
GLOVE BIO SURGEON STRL SZ7.5 (GLOVE) ×2 IMPLANT
GLOVE ECLIPSE 7.0 STRL STRAW (GLOVE) ×4 IMPLANT
GLOVE INDICATOR 7.0 STRL GRN (GLOVE) ×2 IMPLANT
GLOVE INDICATOR 7.5 STRL GRN (GLOVE) ×2 IMPLANT
GOWN STRL REIN XL XLG (GOWN DISPOSABLE) ×6 IMPLANT
INST SET MINOR GENERAL (KITS) ×2 IMPLANT
KIT ROOM TURNOVER APOR (KITS) ×2 IMPLANT
MANIFOLD NEPTUNE II (INSTRUMENTS) ×2 IMPLANT
NEEDLE HYPO 25X1 1.5 SAFETY (NEEDLE) ×2 IMPLANT
NS IRRIG 1000ML POUR BTL (IV SOLUTION) ×2 IMPLANT
PACK MINOR (CUSTOM PROCEDURE TRAY) ×2 IMPLANT
PAD ARMBOARD 7.5X6 YLW CONV (MISCELLANEOUS) ×2 IMPLANT
PATCH VENTRAL SMALL 4.3 (Mesh Specialty) ×2 IMPLANT
SET BASIN LINEN APH (SET/KITS/TRAYS/PACK) ×2 IMPLANT
SPONGE GAUZE 2X2 8PLY STRL LF (GAUZE/BANDAGES/DRESSINGS) IMPLANT
SPONGE GAUZE 4X4 12PLY (GAUZE/BANDAGES/DRESSINGS) ×2 IMPLANT
STAPLER VISISTAT (STAPLE) ×2 IMPLANT
SUT ETHIBOND NAB MO 7 #0 18IN (SUTURE) ×2 IMPLANT
SUT VIC AB 2-0 CT2 27 (SUTURE) ×2 IMPLANT
SUT VIC AB 3-0 SH 27 (SUTURE) ×1
SUT VIC AB 3-0 SH 27X BRD (SUTURE) ×1 IMPLANT
SUT VIC AB 4-0 PS2 27 (SUTURE) IMPLANT
SUT VIC AB 5-0 P-3 18X BRD (SUTURE) IMPLANT
SUT VIC AB 5-0 P3 18 (SUTURE)
SUT VICRYL AB 3 0 TIES (SUTURE) IMPLANT
SYR CONTROL 10ML LL (SYRINGE) ×2 IMPLANT
TAPE CLOTH SURG 4X10 WHT LF (GAUZE/BANDAGES/DRESSINGS) ×2 IMPLANT
YANKAUER SUCT BULB TIP NO VENT (SUCTIONS) ×2 IMPLANT

## 2011-01-23 NOTE — H&P (View-Only) (Signed)
Joanna Mcintosh is an 50 y.o. female.   Chief Complaint: Umbilical hernia HPI: Patient is a 50-year-old white female presented emergency room on 01/22/2011 with an incarcerated umbilical hernia. After pain medication was administered, the hernia was reduced in the emergency room. She now presents for an elective umbilical herniorrhaphy with mesh.  Past Medical History  Diagnosis Date  . Hypertension     Past Surgical History  Procedure Date  . Wrist surgery   . Dilation and curettage of uterus   . Tonsillectomy     Family History  Problem Relation Age of Onset  . Cancer Father   . Cancer Sister    Social History:  reports that she quit smoking about a year ago. Her smoking use included Cigarettes. She quit after 10 years of use. She has never used smokeless tobacco. She reports that she drinks about 1.8 ounces of alcohol per week. She reports that she does not use illicit drugs.  Allergies: No Known Allergies  Medications Prior to Admission  Medication Dose Route Frequency Provider Last Rate Last Dose  . 0.9 %  sodium chloride infusion   Intravenous Once Joseph L Zammit, MD 125 mL/hr at 01/22/11 1203    . HYDROmorphone (DILAUDID) injection 0.5 mg  0.5 mg Intravenous Once Terry S. Strand, MD      . HYDROmorphone (DILAUDID) injection 1 mg  1 mg Intravenous Once Terry S. Strand, MD   1 mg at 01/21/11 1729  . HYDROmorphone (DILAUDID) injection 1 mg  1 mg Intravenous Once Terry S. Strand, MD   1 mg at 01/21/11 2013  . HYDROmorphone (DILAUDID) injection 1 mg  1 mg Intravenous Once Joseph L Zammit, MD   1 mg at 01/22/11 1202  . HYDROmorphone (DILAUDID) injection 1 mg  1 mg Intravenous Once Joseph L Zammit, MD   1 mg at 01/22/11 1400  . HYDROmorphone (DILAUDID) injection 1 mg  1 mg Intravenous Once Joseph L Zammit, MD   1 mg at 01/22/11 1525  . iohexol (OMNIPAQUE) 300 MG/ML injection 100 mL  100 mL Intravenous Once PRN Medication Radiologist   100 mL at 01/22/11 1432  . metoCLOPramide  (REGLAN) injection 10 mg  10 mg Intravenous Once Terry S. Strand, MD   10 mg at 01/21/11 1632  . morphine 4 MG/ML injection 2 mg  2 mg Intravenous Once Terry S. Strand, MD   2 mg at 01/21/11 1557  . ondansetron (ZOFRAN) injection 4 mg  4 mg Intravenous Once Terry S. Strand, MD   4 mg at 01/21/11 1555  . ondansetron (ZOFRAN) injection 4 mg  4 mg Intravenous Once Terry S. Strand, MD   4 mg at 01/21/11 2012  . ondansetron (ZOFRAN) injection 4 mg  4 mg Intravenous Once Joseph L Zammit, MD   4 mg at 01/22/11 1203  . ondansetron (ZOFRAN) injection 4 mg  4 mg Intravenous Once Joseph L Zammit, MD   4 mg at 01/22/11 1526  . pantoprazole (PROTONIX) injection 40 mg  40 mg Intravenous Once Terry S. Strand, MD   40 mg at 01/21/11 1727  . sodium chloride 0.9 % bolus 1,000 mL  1,000 mL Intravenous Once Terry S. Strand, MD   1,000 mL at 01/21/11 1555  . sodium chloride 0.9 % bolus 1,000 mL  1,000 mL Intravenous Once Terry S. Strand, MD   1,000 mL at 01/21/11 1630  . sodium chloride 0.9 % bolus 1,000 mL  1,000 mL Intravenous Once Terry S. Strand, MD     1,000 mL at 01/21/11 1843  . sodium chloride 0.9 % bolus 1,000 mL  1,000 mL Intravenous Once Terry S. Strand, MD   1,000 mL at 01/21/11 2012  . sodium chloride 0.9 % bolus 1,000 mL  1,000 mL Intravenous Once Terry S. Strand, MD   1,000 mL at 01/21/11 2104  . DISCONTD: diphenoxylate-atropine (LOMOTIL) 2.5-0.025 MG per tablet 2 tablet  2 tablet Oral Once Terry S. Strand, MD      . DISCONTD: metoCLOPramide (REGLAN) 5 MG/ML injection           . DISCONTD: morphine 4 MG/ML injection 2 mg  2 mg Intravenous Once Joseph L Zammit, MD       Medications Prior to Admission  Medication Sig Dispense Refill  . HYDROcodone-acetaminophen (NORCO) 5-325 MG per tablet Take 1 tablet by mouth every 4 (four) hours as needed for pain.  15 tablet  0  . ibuprofen (ADVIL,MOTRIN) 200 MG tablet Take 600 mg by mouth every 6 (six) hours as needed. Pain       . losartan (COZAAR) 100 MG tablet Take  100 mg by mouth daily.        . ondansetron (ZOFRAN) 4 MG tablet Take 1 tablet (4 mg total) by mouth every 6 (six) hours.  12 tablet  0  . RABEprazole (ACIPHEX) 20 MG tablet Take 20 mg by mouth daily.          Results for orders placed during the hospital encounter of 01/22/11 (from the past 48 hour(s))  CBC     Status: Abnormal   Collection Time   01/22/11 12:06 PM      Component Value Range Comment   WBC 14.0 (*) 4.0 - 10.5 (K/uL)    RBC 4.21  3.87 - 5.11 (MIL/uL)    Hemoglobin 12.6  12.0 - 15.0 (g/dL)    HCT 37.6  36.0 - 46.0 (%)    MCV 89.3  78.0 - 100.0 (fL)    MCH 29.9  26.0 - 34.0 (pg)    MCHC 33.5  30.0 - 36.0 (g/dL)    RDW 14.3  11.5 - 15.5 (%)    Platelets 343  150 - 400 (K/uL)   DIFFERENTIAL     Status: Abnormal   Collection Time   01/22/11 12:06 PM      Component Value Range Comment   Neutrophils Relative 83 (*) 43 - 77 (%)    Neutro Abs 11.6 (*) 1.7 - 7.7 (K/uL)    Lymphocytes Relative 12  12 - 46 (%)    Lymphs Abs 1.6  0.7 - 4.0 (K/uL)    Monocytes Relative 6  3 - 12 (%)    Monocytes Absolute 0.8  0.1 - 1.0 (K/uL)    Eosinophils Relative 0  0 - 5 (%)    Eosinophils Absolute 0.0  0.0 - 0.7 (K/uL)    Basophils Relative 0  0 - 1 (%)    Basophils Absolute 0.0  0.0 - 0.1 (K/uL)   COMPREHENSIVE METABOLIC PANEL     Status: Abnormal   Collection Time   01/22/11 12:06 PM      Component Value Range Comment   Sodium 138  135 - 145 (mEq/L)    Potassium 3.2 (*) 3.5 - 5.1 (mEq/L)    Chloride 103  96 - 112 (mEq/L)    CO2 22  19 - 32 (mEq/L)    Glucose, Bld 118 (*) 70 - 99 (mg/dL)    BUN 12  6 - 23 (mg/dL)      Creatinine, Ser 0.56  0.50 - 1.10 (mg/dL)    Calcium 9.5  8.4 - 10.5 (mg/dL)    Total Protein 6.9  6.0 - 8.3 (g/dL)    Albumin 3.5  3.5 - 5.2 (g/dL)    AST 16  0 - 37 (U/L)    ALT 17  0 - 35 (U/L)    Alkaline Phosphatase 79  39 - 117 (U/L)    Total Bilirubin 1.1  0.3 - 1.2 (mg/dL)    GFR calc non Af Amer >90  >90 (mL/min)    GFR calc Af Amer >90  >90 (mL/min)     LIPASE, BLOOD     Status: Normal   Collection Time   01/22/11 12:06 PM      Component Value Range Comment   Lipase 16  11 - 59 (U/L)   URINALYSIS, ROUTINE W REFLEX MICROSCOPIC     Status: Abnormal   Collection Time   01/22/11  1:42 PM      Component Value Range Comment   Color, Urine YELLOW  YELLOW     APPearance CLOUDY (*) CLEAR     Specific Gravity, Urine >1.030 (*) 1.005 - 1.030     pH 6.0  5.0 - 8.0     Glucose, UA NEGATIVE  NEGATIVE (mg/dL)    Hgb urine dipstick LARGE (*) NEGATIVE     Bilirubin Urine NEGATIVE  NEGATIVE     Ketones, ur TRACE (*) NEGATIVE (mg/dL)    Protein, ur 30 (*) NEGATIVE (mg/dL)    Urobilinogen, UA 0.2  0.0 - 1.0 (mg/dL)    Nitrite NEGATIVE  NEGATIVE     Leukocytes, UA NEGATIVE  NEGATIVE    PREGNANCY, URINE     Status: Normal   Collection Time   01/22/11  1:42 PM      Component Value Range Comment   Preg Test, Ur NEGATIVE     URINE MICROSCOPIC-ADD ON     Status: Abnormal   Collection Time   01/22/11  1:42 PM      Component Value Range Comment   Squamous Epithelial / LPF MANY (*) RARE     WBC, UA 0-2  <3 (WBC/hpf)    RBC / HPF 3-6  <3 (RBC/hpf)    Bacteria, UA MANY (*) RARE     Ct Abdomen Pelvis W Contrast  01/22/2011  *RADIOLOGY REPORT*  Clinical Data: Abdominal pain, vomiting  CT ABDOMEN AND PELVIS WITH CONTRAST  Technique:  Multidetector CT imaging of the abdomen and pelvis was performed following the standard protocol during bolus administration of intravenous contrast. Sagittal and coronal MPR images reconstructed from axial data set.  Contrast: 100mL OMNIPAQUE IOHEXOL 300 MG/ML IV SOLN Dilute oral contrast.  Comparison: None  Findings: Lung bases clear. Liver, spleen, pancreas, kidneys, and adrenal glands normal appearance. Small amount of free intraperitoneal fluid perihepatic, at the gutters, and in the pelvis. High-grade small bowel obstruction secondary to the an umbilical hernia. Small bowel is dilated proximal to the hernia sac and  decompressed distally. No definite bowel wall thickening or free intraperitoneal air identified. No definite mass, adenopathy or free fluid. Mild diffuse edema seen within mesentery compatible with small bowel obstruction. Distended gallbladder with fold near fundus. Stomach appears distended. No focal colonic abnormalities. Bladder, uterus adnexae, and appendix normal appearance. Osseous demineralization.  IMPRESSION: High-grade small bowel obstruction secondary to an umbilical hernia containing a mid small bowel loop. Scattered ascites and mild mesenteric edema. No evidence of bowel perforation or definite wall thickening.    Findings called to Dr. Zammit on 01/22/2011 at 1445 hours.  Original Report Authenticated By: Belvia Gotschall A. BOLES, M.D.   Dg Abd Acute W/chest  01/21/2011  *RADIOLOGY REPORT*  Clinical Data: Vomiting and diarrhea.  ACUTE ABDOMEN SERIES (ABDOMEN 2 VIEW & CHEST 1 VIEW)  Comparison: None  Findings: The upright chest x-ray demonstrates no acute cardiopulmonary findings.  The heart is normal in size and the mediastinal and hilar contours are normal.  Two views of the abdomen demonstrate a paucity of bowel gas.  The soft tissue shadows are maintained.  No findings for obstruction or perforation.  The bony structures are unremarkable.  IMPRESSION:  1.  No acute cardiopulmonary findings. 2.  No plain film findings for an acute abdominal process.  Original Report Authenticated By: P. Neko Mcgeehan GALLERANI, M.D.    Review of Systems  Constitutional: Positive for chills.  HENT: Negative.   Eyes: Negative.   Respiratory: Negative.   Cardiovascular: Negative.   Gastrointestinal: Positive for nausea, vomiting and abdominal pain.  Genitourinary: Negative.   Musculoskeletal: Negative.   Skin: Negative.   Neurological: Negative.   Endo/Heme/Allergies: Negative.   Psychiatric/Behavioral: Negative.     Blood pressure 160/98, pulse 114, temperature 97.4 F (36.3 C), temperature source Oral, resp. rate  20, height 5' 8" (1.727 m), weight 79.379 kg (175 lb), last menstrual period 12/30/2010, SpO2 97.00%. Physical Exam  Constitutional: She is oriented to person, place, and time. She appears well-developed and well-nourished.  HENT:  Head: Normocephalic.  Neck: Normal range of motion. Neck supple.  Cardiovascular: Normal rate, regular rhythm and normal heart sounds.   Respiratory: Effort normal and breath sounds normal.  GI: Soft. Bowel sounds are normal. She exhibits no distension. There is tenderness.       Umbilical hernia reduced in ER.  Neurological: She is alert and oriented to person, place, and time.  Skin: Skin is warm and dry.     Assessment/Plan Impression: Umbilical hernia Plan: Patient is scheduled undergo an umbilical herniorrhaphy with mesh on 01/23/2011. Risks and benefits of the procedure including bleeding, infection, and a possibly recurrence of the hernia were fully explained to the patient, gave informed consent. She was noted to have a low potassium and she'll be given potassium supplementation prior to surgery.  Yanna Leaks A 01/22/2011, 4:01 PM    

## 2011-01-23 NOTE — Anesthesia Postprocedure Evaluation (Signed)
  Anesthesia Post-op Note  Patient: Joanna Mcintosh  Procedure(s) Performed:  HERNIA REPAIR UMBILICAL ADULT  Patient Location: PACU  Anesthesia Type: General  Level of Consciousness: awake, alert , oriented and patient cooperative  Airway and Oxygen Therapy: Patient Spontanous Breathing and Patient connected to face mask oxygen  Post-op Pain: 5 /10, moderate  Post-op Assessment: Post-op Vital signs reviewed, Patient's Cardiovascular Status Stable, Respiratory Function Stable, Patent Airway and No signs of Nausea or vomiting  Post-op Vital Signs: Reviewed and stable  Complications: No apparent anesthesia complications

## 2011-01-23 NOTE — Op Note (Signed)
Patient:  Joanna Mcintosh  DOB:  [birth year]  MRN:  [id number]   Preop Diagnosis:  Umbilical hernia  Postop Diagnosis:  Same  Procedure:  Umbilical herniorrhaphy with mesh  Surgeon:  Franky Macho, M.D.  Anes:  General endotracheal  Indications:  Patient is a 50 year old white female who presented to the emergency room yesterday with an incarcerated umbilical hernia. This was reduced in the emergency room after sedation and the patient now comes the operating room for an umbilical herniorrhaphy with mesh. The risks and benefits of the procedure including bleeding, infection, possibly recurrence of the hernia were fully explained to the patient, gave informed consent.  Procedure note:  Patient is placed the supine position. After induction of general endotracheal anesthesia, the abdomen was prepped and draped using usual sterile technique with DuraPrep. Surgical site confirmation was performed.  An infraumbilical incision was made down to the fascia. The umbilicus was freed away from the underlying hernia sac. Omentum was noted within the hernia sac. This was reduced and the hernia sac excised without difficulty. A medium-sized proceed ventral patch was then inserted and secured to the fascia using 0 Ethibond interrupted sutures. The overlying fascia was reapproximated transversely using 0 interrupted Ethibond sutures. The umbilicus was secured back to the fascia using a 2-0 Vicryl interrupted suture. The subcutaneous layer was reapproximated using 3-0 Vicryl interrupted sutures. The skin was closed using staples. 0.5 sent Sensorcaine was instilled the surrounding wound. Betadine ointment dry sterile dressing were applied.  All tape and needle counts were correct the end of the procedure. Patient was extubated in the operating room and went back to recovery room awake in stable condition.  Complications:  None  EBL:  Minimal  Specimen:  None

## 2011-01-23 NOTE — Interval H&P Note (Signed)
History and Physical Interval Note:  01/23/2011 12:26 PM  Joanna Mcintosh  has presented today for surgery, with the diagnosis of umbilical hernia  The various methods of treatment have been discussed with the patient and family. After consideration of risks, benefits and other options for treatment, the patient has consented to  Procedure(s): HERNIA REPAIR UMBILICAL ADULT as a surgical intervention .  The patients' history has been reviewed, patient examined, no change in status, stable for surgery.  I have reviewed the patients' chart and labs.  Questions were answered to the patient's satisfaction.     Franky Macho A

## 2011-01-23 NOTE — Anesthesia Procedure Notes (Signed)
Procedure Name: Intubation Date/Time: 01/23/2011 12:42 PM Performed by: Glynn Octave Pre-anesthesia Checklist: Patient identified, Patient being monitored, Timeout performed, Emergency Drugs available and Suction available Patient Re-evaluated:Patient Re-evaluated prior to inductionOxygen Delivery Method: Circle System Utilized Preoxygenation: Pre-oxygenation with 100% oxygen Intubation Type: IV induction and Circoid Pressure applied Laryngoscope Size: Mac and 3 Grade View: Grade II Tube type: Oral Tube size: 7.0 mm Number of attempts: 1 Airway Equipment and Method: stylet Placement Confirmation: ETT inserted through vocal cords under direct vision,  positive ETCO2 and breath sounds checked- equal and bilateral Secured at: 21 cm Tube secured with: Tape Dental Injury: Teeth and Oropharynx as per pre-operative assessment

## 2011-01-23 NOTE — Transfer of Care (Signed)
Immediate Anesthesia Transfer of Care Note  Patient: Joanna Mcintosh  Procedure(s) Performed:  HERNIA REPAIR UMBILICAL ADULT  Patient Location: PACU  Anesthesia Type: General  Level of Consciousness: awake, alert , oriented and patient cooperative  Airway & Oxygen Therapy: Patient Spontanous Breathing and Patient connected to face mask oxygen  Post-op Assessment: Report given to PACU RN, Post -op Vital signs reviewed and stable and Patient moving all extremities  Post vital signs: Reviewed and stable  Complications: No apparent anesthesia complications

## 2011-01-23 NOTE — Anesthesia Preprocedure Evaluation (Addendum)
Anesthesia Evaluation  Patient identified by MRN, date of birth, ID band Patient awake    History of Anesthesia Complications Negative for: history of anesthetic complications  Airway Mallampati: I      Dental  (+) Teeth Intact   Pulmonary former smoker   Pulmonary exam normal       Cardiovascular hypertension (poorly controled), Pt. on medications Regular Normal    Neuro/Psych    GI/Hepatic GERD-  ,  Endo/Other    Renal/GU      Musculoskeletal   Abdominal   Peds  Hematology   Anesthesia Other Findings   Reproductive/Obstetrics                           Anesthesia Physical Anesthesia Plan  ASA: II  Anesthesia Plan: General   Post-op Pain Management:    Induction: Intravenous, Rapid sequence and Cricoid pressure planned  Airway Management Planned:   Additional Equipment:   Intra-op Plan:   Post-operative Plan: Extubation in OR  Informed Consent: I have reviewed the patients History and Physical, chart, labs and discussed the procedure including the risks, benefits and alternatives for the proposed anesthesia with the patient or authorized representative who has indicated his/her understanding and acceptance.     Plan Discussed with:   Anesthesia Plan Comments:         Anesthesia Quick Evaluation

## 2011-01-28 ENCOUNTER — Encounter (HOSPITAL_COMMUNITY): Payer: Self-pay | Admitting: General Surgery

## 2012-09-02 ENCOUNTER — Ambulatory Visit
Admission: RE | Admit: 2012-09-02 | Discharge: 2012-09-02 | Disposition: A | Discharge status: Home / Self Care | Payer: 59 | Source: Ambulatory Visit | Attending: Family Medicine | Admitting: Family Medicine

## 2012-09-02 ENCOUNTER — Other Ambulatory Visit: Payer: Self-pay | Admitting: Family Medicine

## 2012-09-02 DIAGNOSIS — M545 Low back pain: Secondary | ICD-10-CM

## 2012-10-27 IMAGING — CT CT ABD-PELV W/ CM
2 of 4 series · 16 of 46 positions shown, 18 images · IV contrast (Omnipaque 300)
Comparison: None

CLINICAL DATA: Abdominal pain, vomiting

CT ABDOMEN AND PELVIS WITH CONTRAST
TECHNIQUE: Multidetector CT imaging of the abdomen and pelvis was
performed following the standard protocol during bolus
administration of intravenous contrast. Sagittal and coronal MPR
images reconstructed from axial data set.
Contrast: 100mL OMNIPAQUE IOHEXOL 300 MG/ML IV SOLN Dilute oral
contrast.

[Series 2: abd_pel_with 5.0 b40f · axial · 0.74mm/px · z∈[-453,+7]mm · 13 of 102 slices shown, 15 images]
[im 5/102  soft-tissue]
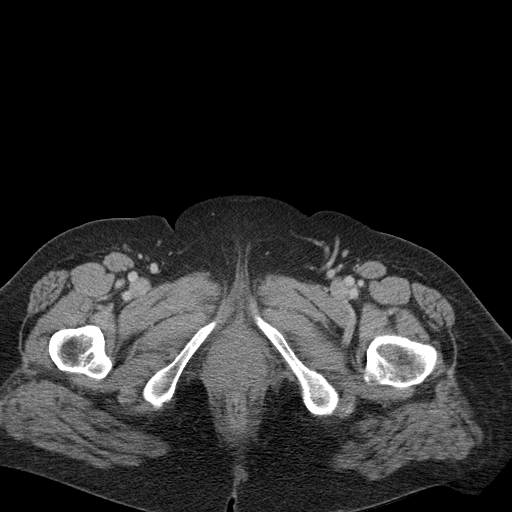
[im 5/102  bone]
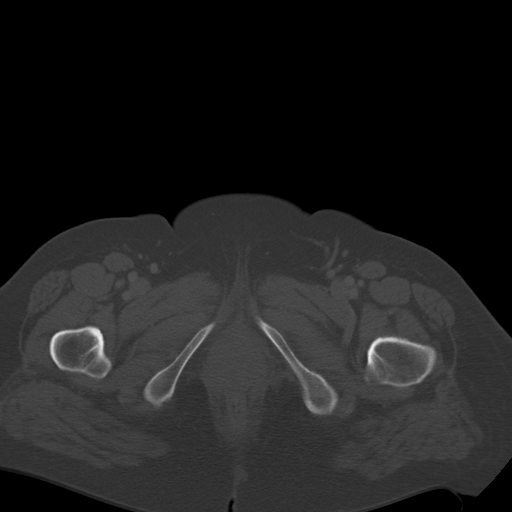
[im 15/102  soft-tissue]
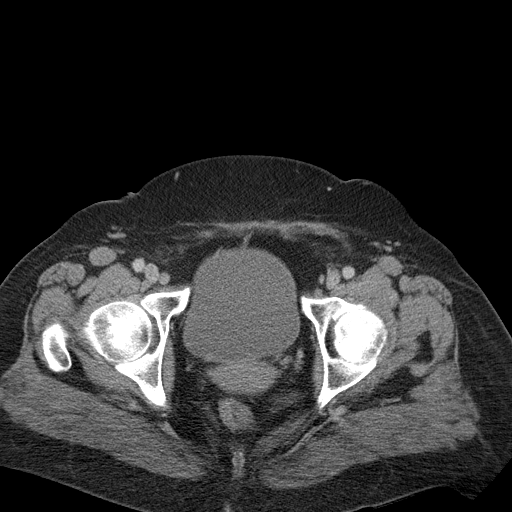
[im 20/102  soft-tissue]
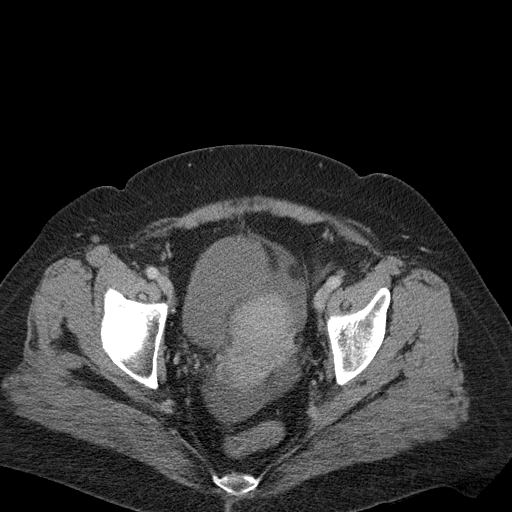
[im 29/102  soft-tissue]
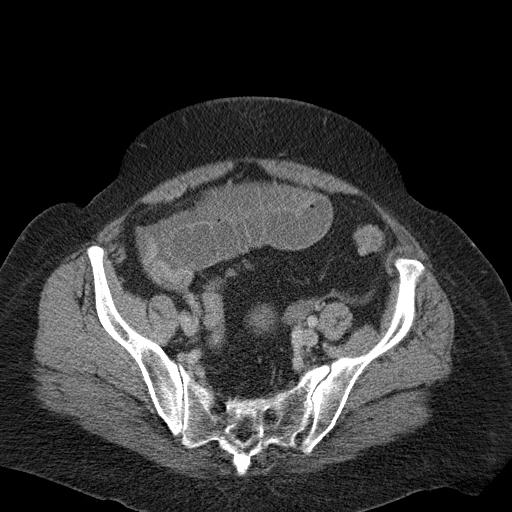
[im 34/102  soft-tissue]
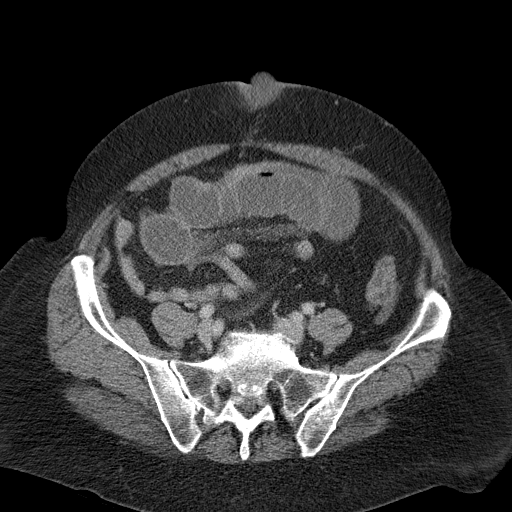
[im 44/102  soft-tissue]
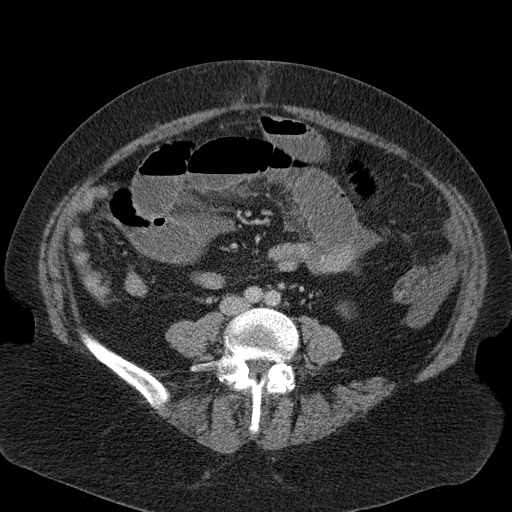
[im 53/102  soft-tissue]
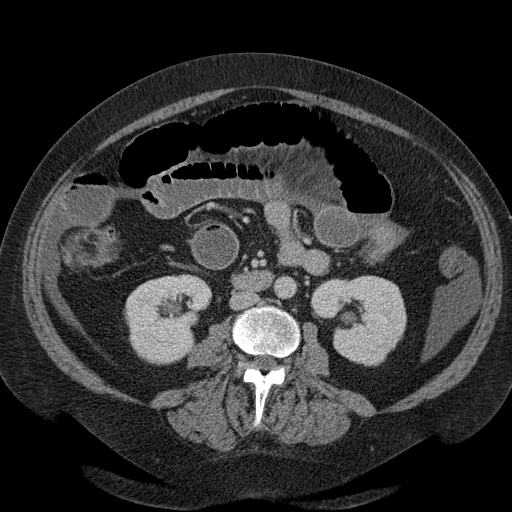
[im 58/102  soft-tissue]
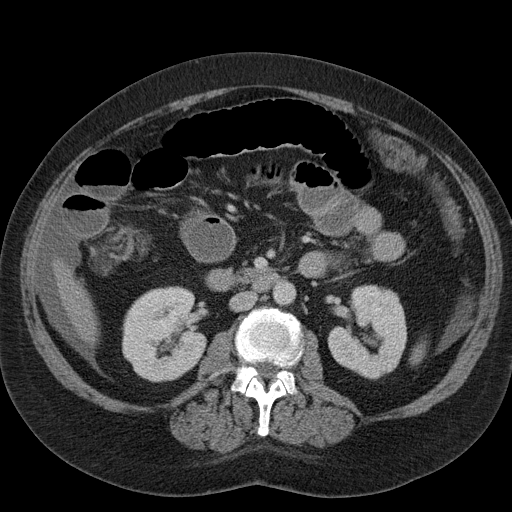
[im 68/102  soft-tissue]
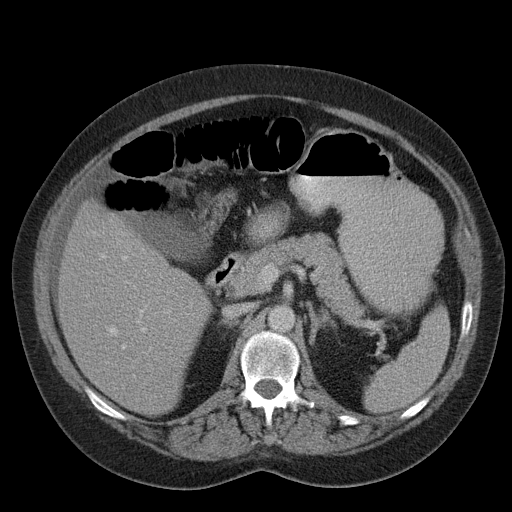
[im 68/102  bone]
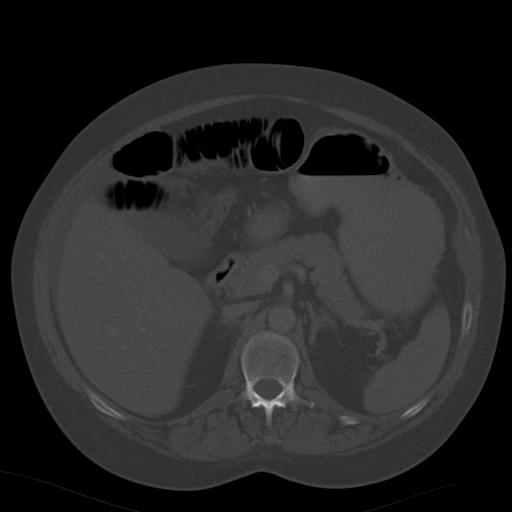
[im 73/102  soft-tissue]
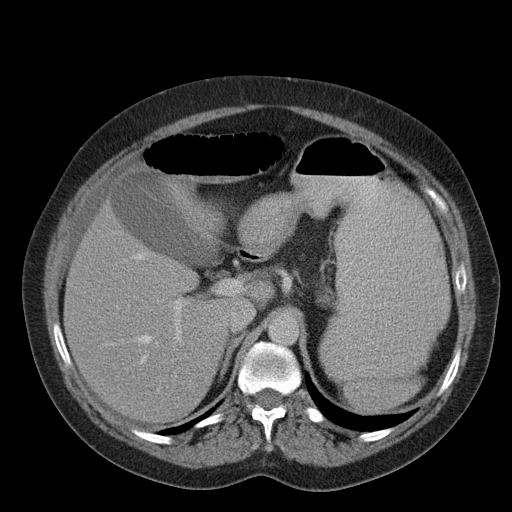
[im 82/102  soft-tissue]
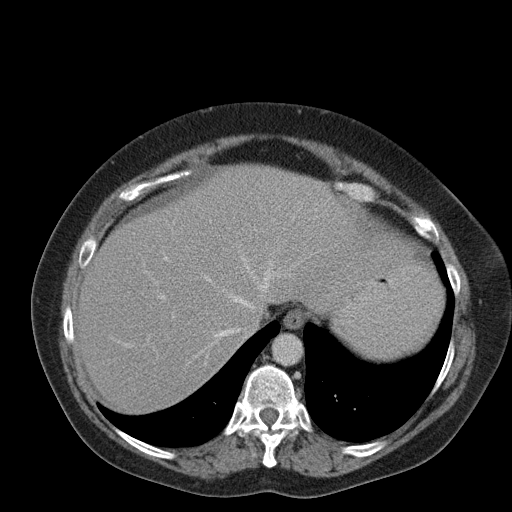
[im 87/102  soft-tissue]
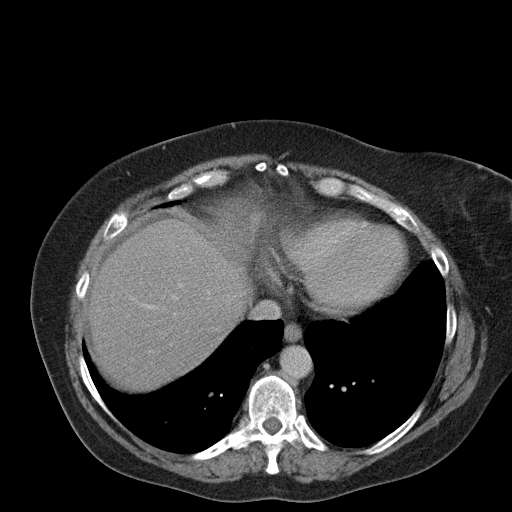
[im 97/102  soft-tissue]
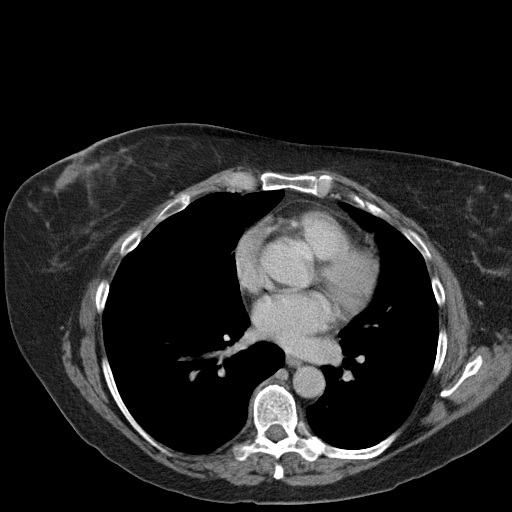

[Series 4: abd_pel_with 3.0 spo cor · coronal · 0.76mm/px · 3 of 100 slices shown]
[im 34/100  soft-tissue]
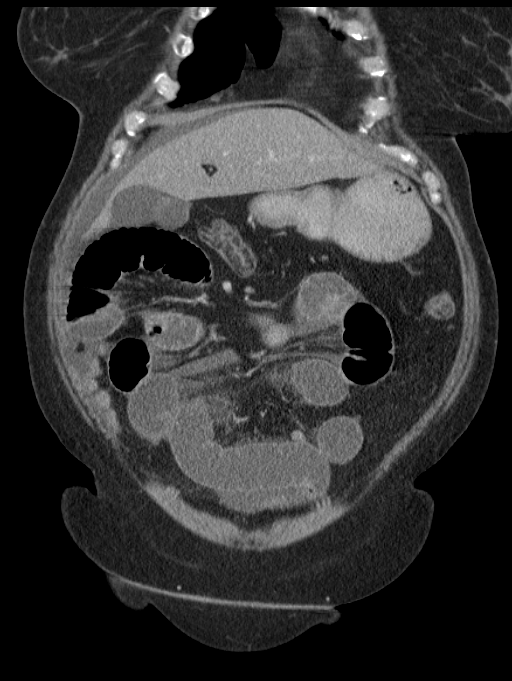
[im 45/100  soft-tissue]
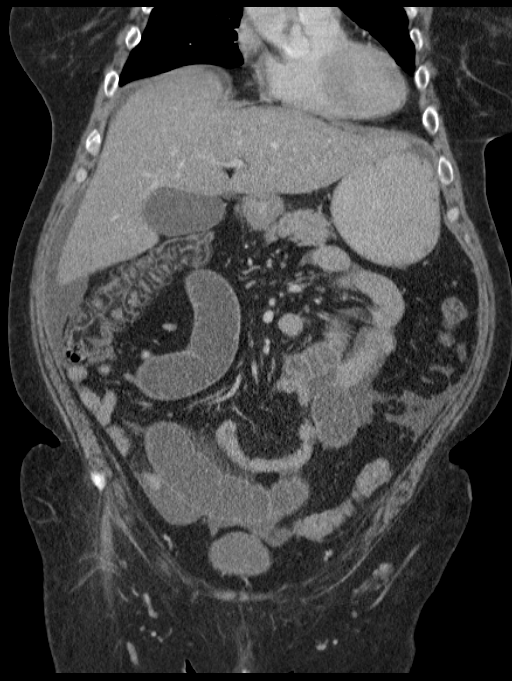
[im 56/100  soft-tissue]
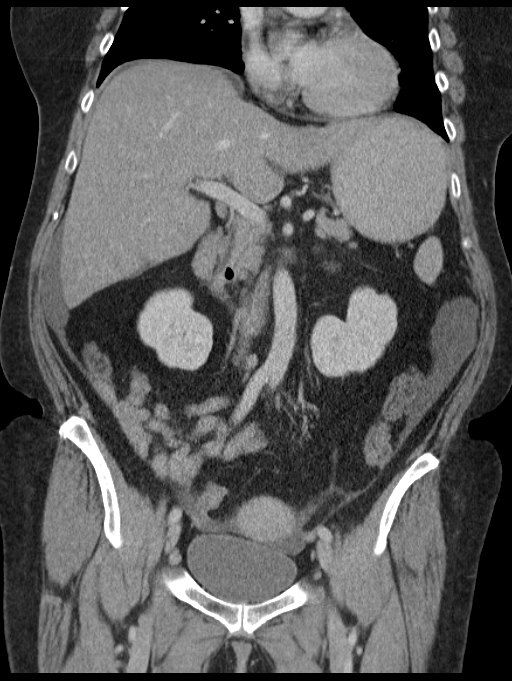

[16 of 46 positions shown; findings below may reference images not displayed]

FINDINGS: Lung bases clear.
Liver, spleen, pancreas, kidneys, and adrenal glands normal
appearance.
Small amount of free intraperitoneal fluid perihepatic, at the
gutters, and in the pelvis.
High-grade small bowel obstruction secondary to the an umbilical
hernia.
Small bowel is dilated proximal to the hernia sac and decompressed
distally.
No definite bowel wall thickening or free intraperitoneal air
identified.
No definite mass, adenopathy or free fluid.
Mild diffuse edema seen within mesentery compatible with small
bowel obstruction.
Distended gallbladder with fold near fundus.
Stomach appears distended.
No focal colonic abnormalities.
Bladder, uterus adnexae, and appendix normal appearance.
Osseous demineralization.
IMPRESSION: High-grade small bowel obstruction secondary to an umbilical hernia
containing a mid small bowel loop.
Scattered ascites and mild mesenteric edema.
No evidence of bowel perforation or definite wall thickening.

Findings called to Dr. Davani on 01/22/2011 at 6221 hours.

## 2013-03-17 ENCOUNTER — Ambulatory Visit (INDEPENDENT_AMBULATORY_CARE_PROVIDER_SITE_OTHER): Payer: 59 | Admitting: Physician Assistant

## 2013-03-17 VITALS — BP 166/98 | HR 101 | Temp 98.8°F | Resp 18 | Ht 66.0 in | Wt 189.0 lb

## 2013-03-17 DIAGNOSIS — M109 Gout, unspecified: Secondary | ICD-10-CM

## 2013-03-17 DIAGNOSIS — M79671 Pain in right foot: Secondary | ICD-10-CM

## 2013-03-17 DIAGNOSIS — M79609 Pain in unspecified limb: Secondary | ICD-10-CM

## 2013-03-17 MED ORDER — INDOMETHACIN 50 MG PO CAPS: 50.0000 mg | ORAL_CAPSULE | Freq: Three times a day (TID) | ORAL | Status: DC

## 2013-03-17 MED ORDER — HYDROCODONE-ACETAMINOPHEN 5-325 MG PO TABS: 1.0000 | ORAL_TABLET | Freq: Four times a day (QID) | ORAL | Status: DC | PRN

## 2013-03-17 NOTE — Progress Notes (Signed)
   Subjective:    Patient ID: Joanna Mcintosh, female    DOB: 1960/05/26, 53 y.o.   MRN: [id number]  HPI   Joanna Mcintosh is a pleasant 53 yr old female here with concern for gout.  She has a history of this.  Was initially diagnosed in Nov 2013, then had a flare in Feb 2014, and none since until yesterday.  Right great toe/foot extremely painful.  Bed sheet tenderness.  Can't put shoe on.  Difficulty bearing weight.  Has not taken anything for current flare.  This feels exactly like first two gout flare.  Thinks she had labs done at diagnosis, but doesn't recall her uric acid.  She is not taking daily medication.  Denies fever, chills.   Review of Systems  Constitutional: Negative for fever and chills.  Respiratory: Negative.   Cardiovascular: Negative.   Gastrointestinal: Negative.   Musculoskeletal: Positive for arthralgias, gait problem and joint swelling.  Skin: Positive for color change.       Objective:   Physical Exam  Vitals reviewed. Constitutional: She is oriented to person, place, and time. She appears well-developed and well-nourished. No distress.  HENT:  Head: Normocephalic and atraumatic.  Eyes: Conjunctivae are normal. No scleral icterus.  Cardiovascular: Normal rate, regular rhythm and normal heart sounds.   Pulmonary/Chest: Effort normal and breath sounds normal. She has no wheezes. She has no rales.  Musculoskeletal:       Right foot: She exhibits decreased range of motion, tenderness and swelling (slight). She exhibits normal capillary refill.       Feet:  Right great toe exquisitely tender even to the lightest touch; pain primarily at great toe and 1st MTP but some pain over the foot as well; no ankle or knee pain; pulses 2+, cap refill normal  Neurological: She is alert and oriented to person, place, and time.  Skin: Skin is warm and dry.  Psychiatric: She has a normal mood and affect. Her behavior is normal.       Assessment & Plan:  Gout flare - Plan:  indomethacin (INDOCIN) 50 MG capsule  Right foot pain - Plan: indomethacin (INDOCIN) 50 MG capsule, HYDROcodone-acetaminophen (NORCO) 5-325 MG per tablet   Joanna Mcintosh is a pleasant 53 yr old female here with right foot pain that is c/w gout flare.  Pain has been present since yesterday and is similar to previous gout flare.  Afebrile, no evidence of infection.  Will treat as gout with indomethacin TID.  Discussed dietary changes and possibility of urate lowering therapy if gout flares happening more frequently.  Pt understands and agrees.  Pt to call or RTC if worsening or not improving   E. Frances FurbishElizabeth Marinda Tyer MHS, PA-C [facility] 1/22/20155:46 PM

## 2013-03-17 NOTE — Patient Instructions (Signed)
Begin taking the indomethacin every 8 hours with food.  Take until the pain is resolved and then continue for another day or so past that.    Use the Norco for the next day or so every 6 hours as needed for pain  Rest and elevate the foot today  Please let us know if any symptoms are worsening or not improving   Gout Gout is an inflammatory arthritis caused by a buildup of uric acid crystals in the joints. Uric acid is a chemical that is normally present in the blood. When the level of uric acid in the blood is too high it can form crystals that deposit in your joints and tissues. This causes joint redness, soreness, and swelling (inflammation). Repeat attacks are common. Over time, uric acid crystals can form into masses (tophi) near a joint, destroying bone and causing disfigurement. Gout is treatable and often preventable. CAUSES  The disease begins with elevated levels of uric acid in the blood. Uric acid is produced by your body when it breaks down a naturally found substance called purines. Certain foods you eat, such as meats and fish, contain high amounts of purines. Causes of an elevated uric acid level include:  Being passed down from parent to child (heredity).  Diseases that cause increased uric acid production (such as obesity, psoriasis, and certain cancers).  Excessive alcohol use.  Diet, especially diets rich in meat and seafood.  Medicines, including certain cancer-fighting medicines (chemotherapy), water pills (diuretics), and aspirin.  Chronic kidney disease. The kidneys are no longer able to remove uric acid well.  Problems with metabolism. Conditions strongly associated with gout include:  Obesity.  High blood pressure.  High cholesterol.  Diabetes. Not everyone with elevated uric acid levels gets gout. It is not understood why some people get gout and others do not. Surgery, joint injury, and eating too much of certain foods are some of the factors that can  lead to gout attacks. SYMPTOMS   An attack of gout comes on quickly. It causes intense pain with redness, swelling, and warmth in a joint.  Fever can occur.  Often, only one joint is involved. Certain joints are more commonly involved:  Base of the big toe.  Knee.  Ankle.  Wrist.  Finger. Without treatment, an attack usually goes away in a few days to weeks. Between attacks, you usually will not have symptoms, which is different from many other forms of arthritis. DIAGNOSIS  Your caregiver will suspect gout based on your symptoms and exam. In some cases, tests may be recommended. The tests may include:  Blood tests.  Urine tests.  X-rays.  Joint fluid exam. This exam requires a needle to remove fluid from the joint (arthrocentesis). Using a microscope, gout is confirmed when uric acid crystals are seen in the joint fluid. TREATMENT  There are two phases to gout treatment: treating the sudden onset (acute) attack and preventing attacks (prophylaxis).  Treatment of an Acute Attack.  Medicines are used. These include anti-inflammatory medicines or steroid medicines.  An injection of steroid medicine into the affected joint is sometimes necessary.  The painful joint is rested. Movement can worsen the arthritis.  You may use warm or cold treatments on painful joints, depending which works best for you.  Treatment to Prevent Attacks.  If you suffer from frequent gout attacks, your caregiver may advise preventive medicine. These medicines are started after the acute attack subsides. These medicines either help your kidneys eliminate uric acid from your  body or decrease your uric acid production. You may need to stay on these medicines for a very long time.  The early phase of treatment with preventive medicine can be associated with an increase in acute gout attacks. For this reason, during the first few months of treatment, your caregiver may also advise you to take medicines  usually used for acute gout treatment. Be sure you understand your caregiver's directions. Your caregiver may make several adjustments to your medicine dose before these medicines are effective.  Discuss dietary treatment with your caregiver or dietitian. Alcohol and drinks high in sugar and fructose and foods such as meat, poultry, and seafood can increase uric acid levels. Your caregiver or dietician can advise you on drinks and foods that should be limited. HOME CARE INSTRUCTIONS   Do not take aspirin to relieve pain. This raises uric acid levels.  Only take over-the-counter or prescription medicines for pain, discomfort, or fever as directed by your caregiver.  Rest the joint as much as possible. When in bed, keep sheets and blankets off painful areas.  Keep the affected joint raised (elevated).  Apply warm or cold treatments to painful joints. Use of warm or cold treatments depends on which works best for you.  Use crutches if the painful joint is in your leg.  Drink enough fluids to keep your urine clear or pale yellow. This helps your body get rid of uric acid. Limit alcohol, sugary drinks, and fructose drinks.  Follow your dietary instructions. Pay careful attention to the amount of protein you eat. Your daily diet should emphasize fruits, vegetables, whole grains, and fat-free or low-fat milk products. Discuss the use of coffee, vitamin C, and cherries with your caregiver or dietician. These may be helpful in lowering uric acid levels.  Maintain a healthy body weight. SEEK MEDICAL CARE IF:   You develop diarrhea, vomiting, or any side effects from medicines.  You do not feel better in 24 hours, or you are getting worse. SEEK IMMEDIATE MEDICAL CARE IF:   Your joint becomes suddenly more tender, and you have chills or a fever. MAKE SURE YOU:   Understand these instructions.  Will watch your condition.  Will get help right away if you are not doing well or get  worse. Document Released: 02/08/2000 Document Revised: 06/07/2012 Document Reviewed: 09/24/2011 [facility] Patient Information 2014 [place], [place].

## 2013-08-23 ENCOUNTER — Other Ambulatory Visit: Payer: Self-pay | Admitting: Family Medicine

## 2013-08-23 DIAGNOSIS — M545 Low back pain, unspecified: Secondary | ICD-10-CM

## 2013-09-03 ENCOUNTER — Other Ambulatory Visit: Payer: 59

## 2013-09-06 ENCOUNTER — Other Ambulatory Visit: Payer: Self-pay | Admitting: Family Medicine

## 2013-09-06 ENCOUNTER — Other Ambulatory Visit: Payer: 59

## 2013-09-15 ENCOUNTER — Other Ambulatory Visit: Payer: 59

## 2013-12-28 ENCOUNTER — Encounter: Payer: Self-pay | Admitting: Obstetrics & Gynecology

## 2013-12-28 ENCOUNTER — Ambulatory Visit (INDEPENDENT_AMBULATORY_CARE_PROVIDER_SITE_OTHER): Payer: 59 | Admitting: Obstetrics & Gynecology

## 2013-12-28 VITALS — BP 180/110 | Ht 68.0 in | Wt 192.0 lb

## 2013-12-28 DIAGNOSIS — N920 Excessive and frequent menstruation with regular cycle: Secondary | ICD-10-CM

## 2013-12-28 DIAGNOSIS — N946 Dysmenorrhea, unspecified: Secondary | ICD-10-CM | POA: Insufficient documentation

## 2013-12-28 LAB — POCT HEMOGLOBIN: HEMOGLOBIN: 11.4 g/dL — AB (ref 12.2–16.2)

## 2013-12-28 MED ORDER — KETOROLAC TROMETHAMINE 10 MG PO TABS: 10.0000 mg | ORAL_TABLET | Freq: Three times a day (TID) | ORAL | Status: DC | PRN

## 2013-12-28 MED ORDER — MEGESTROL ACETATE 40 MG PO TABS: ORAL_TABLET | ORAL | Status: DC

## 2013-12-28 NOTE — Progress Notes (Signed)
Patient ID: Joanna Mcintosh, female   DOB: [birth year], 53 y.o.   MRN: [id number] Chief Complaint  Patient presents with  . GYN VISIT    heavy period. started11-2-15. used 40 tampons/ pads at each period.    HPI Pt with several year history of heavy painful vaginal periods Thought she would stop sometime but has not happened Does not hurt any other time, just the day or two before No dyspareunia Soils sheets and clothes Can't function at that time  ROS No burning with urination, frequency or urgency No nausea, vomiting or diarrhea Nor fever chills or other constitutional symptoms   Blood pressure 180/110, height 5\' 8"  (1.727 m), weight 192 lb (87.091 kg), last menstrual period 12/26/2013.  EXAM Bleeding today pt does not want exam  Assessment/Plan:  menorrhagia Dysmenorrhea  Megace then sonogram and see results and response

## 2013-12-29 ENCOUNTER — Telehealth: Payer: Self-pay | Admitting: Obstetrics & Gynecology

## 2013-12-29 NOTE — Telephone Encounter (Signed)
Pt statesToradol is making her sick, Nausea and vomiting, sick to stomach, she is taken with food.

## 2013-12-30 ENCOUNTER — Telehealth: Payer: Self-pay | Admitting: Obstetrics & Gynecology

## 2013-12-30 MED ORDER — HYDROCODONE-ACETAMINOPHEN 5-325 MG PO TABS: 1.0000 | ORAL_TABLET | Freq: Four times a day (QID) | ORAL | Status: DC | PRN

## 2013-12-30 NOTE — Telephone Encounter (Signed)
Pt informed RX for hydrocodone left at front desk for pt to pick up

## 2014-01-27 ENCOUNTER — Ambulatory Visit: Payer: 59 | Admitting: Obstetrics & Gynecology

## 2014-01-27 ENCOUNTER — Other Ambulatory Visit: Payer: 59

## 2014-01-27 ENCOUNTER — Encounter: Payer: Self-pay | Admitting: Obstetrics & Gynecology

## 2014-02-01 ENCOUNTER — Telehealth: Payer: Self-pay | Admitting: Obstetrics & Gynecology

## 2014-02-01 NOTE — Telephone Encounter (Signed)
Pt state taking Megace 40 mg daily and now is having "real heavy period and pain in abdomen." requesting hydrocodone for pain until her appt for an ultrasound on 12/14 and OV. Missed her appt on 12/04 due to Mother has dementia.

## 2014-02-02 MED ORDER — HYDROCODONE-ACETAMINOPHEN 5-325 MG PO TABS: 1.0000 | ORAL_TABLET | Freq: Four times a day (QID) | ORAL | Status: DC | PRN

## 2014-02-02 NOTE — Telephone Encounter (Signed)
Pt aware Hydrocodone RX left at front desk.

## 2014-02-06 ENCOUNTER — Ambulatory Visit (INDEPENDENT_AMBULATORY_CARE_PROVIDER_SITE_OTHER): Payer: 59

## 2014-02-06 ENCOUNTER — Ambulatory Visit (INDEPENDENT_AMBULATORY_CARE_PROVIDER_SITE_OTHER): Payer: 59 | Admitting: Obstetrics & Gynecology

## 2014-02-06 ENCOUNTER — Encounter: Payer: Self-pay | Admitting: Obstetrics & Gynecology

## 2014-02-06 VITALS — BP 200/140 | Ht 68.0 in | Wt 184.0 lb

## 2014-02-06 DIAGNOSIS — N946 Dysmenorrhea, unspecified: Secondary | ICD-10-CM

## 2014-02-06 DIAGNOSIS — N920 Excessive and frequent menstruation with regular cycle: Secondary | ICD-10-CM

## 2014-02-06 MED ORDER — MEGESTROL ACETATE 40 MG PO TABS: ORAL_TABLET | ORAL | Status: DC

## 2014-02-06 MED ORDER — TRIAMTERENE-HCTZ 75-50 MG PO TABS: 1.0000 | ORAL_TABLET | Freq: Every day | ORAL | Status: AC

## 2014-02-06 NOTE — Progress Notes (Signed)
Patient ID: Joanna Mcintosh, female   DOB: 02/01/1961, 53 y.o.   MRN: [id number]  [place] Pelvis Complete  02/06/2014   GYNECOLOGIC SONOGRAM   Joanna Mcintosh is a 53 y.o. [id number] LMP 02/01/2014 for a pelvic sonogram for  menorrhagia and dysmenorrhea. Pt is currently on Megace and did not note  any change with period with meds.  Uterus                      9.2 x 5.7 x 5.6 cm, anteverted with multiple  fibroids noted within largest fibroids = 22 mm  Endometrium          10.9 mm, distorted by fibroids and appears thickened  for Day 6 of cycle (on Megace)  Right ovary             2.6 x 1.5 x 1.5cm,   Left ovary                2.7 x 1.7 x 1.2 cm,   No free fluid or adnexal masses noted within the pelvis  Technician Comments:  Anteverted uterus with multiple fibroids noted, Endom=10.859mm distorted by  fibroids, bilateral adnexa/ovaries appears WNL, no free fluid or adnexal  masses noted    Chari ManningMcBride, Tasha 02/06/2014 11:48 AM   Clinical Impression and recommendations:  I have reviewed the sonogram results above, combined with the patient's  current clinical course, below are my impressions and any appropriate  recommendations for management based on the sonographic findings.  Scattered fibroids, not very large Normal ovaries   Rylan Bernard H 02/06/2014 12:03 PM       Pt with several year history of heavy painful vaginal periods Thought she would stop sometime but has not happened Does not hurt any other time, just the day or two before No dyspareunia Soils sheets and clothes Can't function at that time   Increase to 90 mg of megestrol daily Follow up in 3 months  BP is still quite high, pt to call Dr Tiburcio PeaHarris at [facility] Will add maxzide at this point

## 2014-02-27 ENCOUNTER — Telehealth: Payer: Self-pay | Admitting: Obstetrics & Gynecology

## 2014-02-28 NOTE — Telephone Encounter (Signed)
Pt states started heavy vaginal bleeding x 2 nights ago, also c/o abdominal pain. Please advise what pt can do for bleeding and pt requested something for pain.  Pt states is taking the megestrol as prescribed by Dr. Despina HiddenEure.

## 2014-03-01 ENCOUNTER — Telehealth: Payer: Self-pay | Admitting: Obstetrics & Gynecology

## 2014-03-01 MED ORDER — HYDROCODONE-ACETAMINOPHEN 5-325 MG PO TABS: 1.0000 | ORAL_TABLET | Freq: Four times a day (QID) | ORAL | Status: DC | PRN

## 2014-03-01 NOTE — Telephone Encounter (Signed)
Pt informed Rx for pain medication left at front desk for pickup and pt to make an appt with Dr. Despina HiddenEure. Pt verbalized understanding.

## 2014-03-06 ENCOUNTER — Ambulatory Visit: Payer: Self-pay | Admitting: Obstetrics & Gynecology

## 2014-03-16 ENCOUNTER — Ambulatory Visit: Payer: Self-pay | Admitting: Obstetrics & Gynecology

## 2014-03-23 ENCOUNTER — Ambulatory Visit: Payer: Self-pay | Admitting: Obstetrics & Gynecology

## 2014-05-08 ENCOUNTER — Ambulatory Visit: Payer: 59 | Admitting: Obstetrics & Gynecology

## 2014-05-16 ENCOUNTER — Telehealth: Payer: Self-pay | Admitting: Obstetrics & Gynecology

## 2014-05-16 ENCOUNTER — Telehealth: Payer: Self-pay | Admitting: *Deleted

## 2014-05-16 NOTE — Telephone Encounter (Signed)
Pt c/o heavy period with severe cramping. Requesting refill on Hydrocodone for pain. Pt has an appt with Dr. Despina HiddenEure next Tuesday.

## 2014-05-16 NOTE — Telephone Encounter (Signed)
Duplicate message. 

## 2014-05-18 ENCOUNTER — Ambulatory Visit (INDEPENDENT_AMBULATORY_CARE_PROVIDER_SITE_OTHER): Payer: 59 | Admitting: Family Medicine

## 2014-05-18 ENCOUNTER — Other Ambulatory Visit: Payer: Self-pay | Admitting: Obstetrics & Gynecology

## 2014-05-18 ENCOUNTER — Telehealth: Payer: Self-pay | Admitting: *Deleted

## 2014-05-18 ENCOUNTER — Ambulatory Visit (INDEPENDENT_AMBULATORY_CARE_PROVIDER_SITE_OTHER): Payer: 59

## 2014-05-18 VITALS — BP 180/110 | HR 97 | Temp 98.8°F | Resp 16 | Ht 66.5 in | Wt 186.0 lb

## 2014-05-18 DIAGNOSIS — S90414A Abrasion, right lesser toe(s), initial encounter: Secondary | ICD-10-CM

## 2014-05-18 DIAGNOSIS — M7989 Other specified soft tissue disorders: Secondary | ICD-10-CM

## 2014-05-18 DIAGNOSIS — M79674 Pain in right toe(s): Secondary | ICD-10-CM

## 2014-05-18 DIAGNOSIS — Z8739 Personal history of other diseases of the musculoskeletal system and connective tissue: Secondary | ICD-10-CM

## 2014-05-18 DIAGNOSIS — Z8639 Personal history of other endocrine, nutritional and metabolic disease: Secondary | ICD-10-CM

## 2014-05-18 DIAGNOSIS — M79661 Pain in right lower leg: Secondary | ICD-10-CM | POA: Diagnosis not present

## 2014-05-18 LAB — POCT CBC
Granulocyte percent: 57.6 %G (ref 37–80)
HCT, POC: 32.8 % — AB (ref 37.7–47.9)
Hemoglobin: 10.8 g/dL — AB (ref 12.2–16.2)
LYMPH, POC: 2.3 (ref 0.6–3.4)
MCH: 32.1 pg — AB (ref 27–31.2)
MCHC: 33 g/dL (ref 31.8–35.4)
MCV: 97.2 fL — AB (ref 80–97)
MID (CBC): 0.6 (ref 0–0.9)
MPV: 8.3 fL (ref 0–99.8)
PLATELET COUNT, POC: 289 10*3/uL (ref 142–424)
POC Granulocyte: 3.9 (ref 2–6.9)
POC LYMPH %: 34 % (ref 10–50)
POC MID %: 8.4 % (ref 0–12)
RBC: 3.37 M/uL — AB (ref 4.04–5.48)
RDW, POC: 13.7 %
WBC: 6.8 10*3/uL (ref 4.6–10.2)

## 2014-05-18 LAB — URIC ACID: URIC ACID, SERUM: 8.3 mg/dL — AB (ref 2.4–7.0)

## 2014-05-18 MED ORDER — COLCHICINE 0.6 MG PO TABS: ORAL_TABLET | ORAL | Status: DC

## 2014-05-18 MED ORDER — INDOMETHACIN 50 MG PO CAPS: 50.0000 mg | ORAL_CAPSULE | Freq: Three times a day (TID) | ORAL | Status: AC

## 2014-05-18 MED ORDER — HYDROCODONE-ACETAMINOPHEN 5-325 MG PO TABS: 1.0000 | ORAL_TABLET | Freq: Four times a day (QID) | ORAL | Status: AC | PRN

## 2014-05-18 MED ORDER — DOXYCYCLINE HYCLATE 100 MG PO TABS: 100.0000 mg | ORAL_TABLET | Freq: Two times a day (BID) | ORAL | Status: DC

## 2014-05-18 MED ORDER — COLCHICINE 0.6 MG PO TABS: ORAL_TABLET | ORAL | Status: AC

## 2014-05-18 MED ORDER — DOXYCYCLINE HYCLATE 100 MG PO TABS: 100.0000 mg | ORAL_TABLET | Freq: Two times a day (BID) | ORAL | Status: AC

## 2014-05-18 NOTE — Addendum Note (Signed)
Addended by: Eddie CandleLUCK, Tacey Dimaggio L on: 05/18/2014 07:17 PM   Modules accepted: Orders

## 2014-05-18 NOTE — Patient Instructions (Addendum)
Start colchicine - 3 total doses for this flare. Hydrocodone if needed for pain and doxycycline incase there is a small infection on the skin around the abrasion. You should receive a call or letter about your lab results within the next week to 10 days (gout blood test).  Return to the clinic or go to the nearest emergency room if any of your symptoms worsen or new symptoms occur.  Gout Gout is an inflammatory arthritis caused by a buildup of uric acid crystals in the joints. Uric acid is a chemical that is normally present in the blood. When the level of uric acid in the blood is too high it can form crystals that deposit in your joints and tissues. This causes joint redness, soreness, and swelling (inflammation). Repeat attacks are common. Over time, uric acid crystals can form into masses (tophi) near a joint, destroying bone and causing disfigurement. Gout is treatable and often preventable. CAUSES  The disease begins with elevated levels of uric acid in the blood. Uric acid is produced by your body when it breaks down a naturally found substance called purines. Certain foods you eat, such as meats and fish, contain high amounts of purines. Causes of an elevated uric acid level include:  Being passed down from parent to child (heredity).  Diseases that cause increased uric acid production (such as obesity, psoriasis, and certain cancers).  Excessive alcohol use.  Diet, especially diets rich in meat and seafood.  Medicines, including certain cancer-fighting medicines (chemotherapy), water pills (diuretics), and aspirin.  Chronic kidney disease. The kidneys are no longer able to remove uric acid well.  Problems with metabolism. Conditions strongly associated with gout include:  Obesity.  High blood pressure.  High cholesterol.  Diabetes. Not everyone with elevated uric acid levels gets gout. It is not understood why some people get gout and others do not. Surgery, joint injury, and  eating too much of certain foods are some of the factors that can lead to gout attacks. SYMPTOMS   An attack of gout comes on quickly. It causes intense pain with redness, swelling, and warmth in a joint.  Fever can occur.  Often, only one joint is involved. Certain joints are more commonly involved:  Base of the big toe.  Knee.  Ankle.  Wrist.  Finger. Without treatment, an attack usually goes away in a few days to weeks. Between attacks, you usually will not have symptoms, which is different from many other forms of arthritis. DIAGNOSIS  Your caregiver will suspect gout based on your symptoms and exam. In some cases, tests may be recommended. The tests may include:  Blood tests.  Urine tests.  X-rays.  Joint fluid exam. This exam requires a needle to remove fluid from the joint (arthrocentesis). Using a microscope, gout is confirmed when uric acid crystals are seen in the joint fluid. TREATMENT  There are two phases to gout treatment: treating the sudden onset (acute) attack and preventing attacks (prophylaxis).  Treatment of an Acute Attack.  Medicines are used. These include anti-inflammatory medicines or steroid medicines.  An injection of steroid medicine into the affected joint is sometimes necessary.  The painful joint is rested. Movement can worsen the arthritis.  You may use warm or cold treatments on painful joints, depending which works best for you.  Treatment to Prevent Attacks.  If you suffer from frequent gout attacks, your caregiver may advise preventive medicine. These medicines are started after the acute attack subsides. These medicines either help your kidneys  eliminate uric acid from your body or decrease your uric acid production. You may need to stay on these medicines for a very long time.  The early phase of treatment with preventive medicine can be associated with an increase in acute gout attacks. For this reason, during the first few months  of treatment, your caregiver may also advise you to take medicines usually used for acute gout treatment. Be sure you understand your caregiver's directions. Your caregiver may make several adjustments to your medicine dose before these medicines are effective.  Discuss dietary treatment with your caregiver or dietitian. Alcohol and drinks high in sugar and fructose and foods such as meat, poultry, and seafood can increase uric acid levels. Your caregiver or dietitian can advise you on drinks and foods that should be limited. HOME CARE INSTRUCTIONS   Do not take aspirin to relieve pain. This raises uric acid levels.  Only take over-the-counter or prescription medicines for pain, discomfort, or fever as directed by your caregiver.  Rest the joint as much as possible. When in bed, keep sheets and blankets off painful areas.  Keep the affected joint raised (elevated).  Apply warm or cold treatments to painful joints. Use of warm or cold treatments depends on which works best for you.  Use crutches if the painful joint is in your leg.  Drink enough fluids to keep your urine clear or pale yellow. This helps your body get rid of uric acid. Limit alcohol, sugary drinks, and fructose drinks.  Follow your dietary instructions. Pay careful attention to the amount of protein you eat. Your daily diet should emphasize fruits, vegetables, whole grains, and fat-free or low-fat milk products. Discuss the use of coffee, vitamin C, and cherries with your caregiver or dietitian. These may be helpful in lowering uric acid levels.  Maintain a healthy body weight. SEEK MEDICAL CARE IF:   You develop diarrhea, vomiting, or any side effects from medicines.  You do not feel better in 24 hours, or you are getting worse. SEEK IMMEDIATE MEDICAL CARE IF:   Your joint becomes suddenly more tender, and you have chills or a fever. MAKE SURE YOU:   Understand these instructions.  Will watch your condition.  Will  get help right away if you are not doing well or get worse. Document Released: 02/08/2000 Document Revised: 06/27/2013 Document Reviewed: 09/24/2011 [facility] Patient Information 2015 [place], [place]. This information is not intended to replace advice given to you by your health care provider. Make sure you discuss any questions you have with your health care provider.

## 2014-05-18 NOTE — Progress Notes (Addendum)
Subjective:    Patient ID: Joanna Mcintosh, female    DOB: 12-Sep-1960, 54 y.o.   MRN: [id number] This chart was scribed for Meredith Staggers, MD by Littie Deeds, Medical Scribe. This patient was seen in Room 11 and the patient's care was started at 2:47 PM.    HPI HPI Comments: Joanna Mcintosh is a 54 y.o. female with a hx of gout and HTN who presents to the [facility] complaining of right great toe pain with redness and swelling that started yesterday.  Patient has a hx of gout, initially dx in November, 2013 per review of [facility]. Similar symptoms of initial flare-up in January 2015. Was treated with hydrocodone and anomethacin here in January 2015. Blood pressure was 166/98 at that visit. More recently, she had been seen by her OB/GYN for other concerns, but did note elevated blood pressure of 180/110 in November of last year, followed by 200/140 December 14th, 2015. Was advised to call her PCP, Dr. Tiburcio Pea, but was prescribed Maxzide.  Foot Pain: Patient believes she is having a flare-up of gout. She has had 3 prior flare-ups of gout in the past. The pain is mostly in her great toe, but also radiates to the other toes in her foot. She also notes an abrasion to the area that has been there for 1 week. She has tried ibuprofen for the pain, but she has not taken any pain medications. Patient denies fever, chills, chest pain, chest tightness, SOB, weakness, and speech difficulty. She also denies any new injuries or twisting/turning her foot. She is not on any medications for gout and typically has one flare-up of gout per year. Patient was last given hydrocodone about 1 month ago for back pain. NKDA.  Hypertension: Patient is on Maxzide and clonidine; she has been on clonidine for about a month. She did not take her blood pressure medications because she forgot. She has been getting blood pressure readings around 140/80 while on medications.  Patient Active Problem List   Diagnosis Date  Noted  . Dysmenorrhea 12/28/2013  . Menorrhagia 12/28/2013   Past Medical History  Diagnosis Date  . Hypertension    Past Surgical History  Procedure Laterality Date  . Wrist surgery    . Dilation and curettage of uterus    . Tonsillectomy    . Umbilical hernia repair  01/23/2011    Procedure: HERNIA REPAIR UMBILICAL ADULT;  Surgeon: Dalia Heading;  Location: [facility];  Service: General;  Laterality: N/A;   No Known Allergies Prior to Admission medications   Medication Sig Start Date End Date Taking? Authorizing Provider  Amlodipine-Olmesartan (AZOR PO) Take by mouth.   Yes Historical Provider, MD  cloNIDine (CATAPRES) 0.1 MG tablet Take 0.1 mg by mouth 2 (two) times daily.   Yes Historical Provider, MD  furosemide (LASIX) 10 MG/ML solution Take by mouth daily.   Yes Historical Provider, MD  HYDROcodone-acetaminophen (NORCO/VICODIN) 5-325 MG per tablet Take 1 tablet by mouth every 6 (six) hours as needed. 03/01/14  Yes Lazaro Arms, MD  triamterene-hydrochlorothiazide (MAXZIDE) 75-50 MG per tablet Take 1 tablet by mouth daily. 02/06/14  Yes Lazaro Arms, MD  HYDROcodone-acetaminophen (NORCO) 5-325 MG per tablet Take 1 tablet by mouth every 6 (six) hours as needed. Patient not taking: Reported on 02/06/2014 03/17/13   Godfrey Pick, PA-C  HYDROcodone-acetaminophen (NORCO/VICODIN) 5-325 MG per tablet Take 1 tablet by mouth every 6 (six) hours as needed. Patient not taking: Reported  on 02/06/2014 02/02/14   Lazaro Arms, MD  indomethacin (INDOCIN) 50 MG capsule Take 1 capsule (50 mg total) by mouth 3 (three) times daily with meals. Patient not taking: Reported on 02/06/2014 03/17/13   Godfrey Pick, PA-C  ketorolac (TORADOL) 10 MG tablet Take 1 tablet (10 mg total) by mouth every 8 (eight) hours as needed. Patient not taking: Reported on 02/06/2014 12/28/13   Lazaro Arms, MD  megestrol (MEGACE) 40 MG tablet Take 3 tablets at once daily Patient not taking: Reported on 05/18/2014  02/06/14   Lazaro Arms, MD  RABEprazole (ACIPHEX) 20 MG tablet Take 20 mg by mouth daily.      Historical Provider, MD   History   Social History  . Marital Status: Married    Spouse Name: N/A  . Number of Children: N/A  . Years of Education: N/A   Occupational History  . Not on file.   Social History Main Topics  . Smoking status: Former Smoker -- 10 years    Types: Cigarettes    Quit date: 06/20/2009  . Smokeless tobacco: Never Used  . Alcohol Use: 1.8 oz/week    3 Cans of beer per week  . Drug Use: No  . Sexual Activity: Yes    Birth Control/ Protection: None   Other Topics Concern  . Not on file   Social History Narrative     Review of Systems  Constitutional: Negative for fever and chills.  Respiratory: Negative for chest tightness and shortness of breath.   Cardiovascular: Negative for chest pain.  Skin: Positive for wound.  Neurological: Negative for speech difficulty and weakness.       Objective:   Physical Exam  Constitutional: She is oriented to person, place, and time. She appears well-developed and well-nourished. No distress.  HENT:  Head: Normocephalic and atraumatic.  Mouth/Throat: Oropharynx is clear and moist. No oropharyngeal exudate.  Eyes: Pupils are equal, round, and reactive to light.  Neck: Neck supple.  Cardiovascular: Normal rate.   Pulmonary/Chest: Effort normal.  Musculoskeletal: She exhibits no edema.  Neurological: She is alert and oriented to person, place, and time. No cranial nerve deficit.  Skin: Skin is warm and dry. No rash noted.  1st MTP: approximately 3 mm small abrasion over the dorsum with surrounding erythema and warmth. TTP at the MTP. No exudate or discharge from wound.  Psychiatric: She has a normal mood and affect. Her behavior is normal.  Vitals reviewed.  Filed Vitals:   05/18/14 1418  BP: 180/110  Pulse: 97  Temp: 98.8 F (37.1 C)  TempSrc: Oral  Resp: 16  Height: 5' 6.5" (1.689 m)  Weight: 186 lb  (84.369 kg)  SpO2: 98%    [facility] reading (PRIMARY) by  Dr. Neva Seat: R great toe. Degenerative change vs tophi at 1st MTP. No apparent fracture. Marland Kitchen  Results for orders placed or performed in visit on 05/18/14  POCT CBC  Result Value Ref Range   WBC 6.8 4.6 - 10.2 K/uL   Lymph, poc 2.3 0.6 - 3.4   POC LYMPH PERCENT 34.0 10 - 50 %L   MID (cbc) 0.6 0 - 0.9   POC MID % 8.4 0 - 12 %M   POC Granulocyte 3.9 2 - 6.9   Granulocyte percent 57.6 37 - 80 %G   RBC 3.37 (A) 4.04 - 5.48 M/uL   Hemoglobin 10.8 (A) 12.2 - 16.2 g/dL   HCT, POC 16.1 (A) 09.6 - 47.9 %  MCV 97.2 (A) 80 - 97 fL   MCH, POC 32.1 (A) 27 - 31.2 pg   MCHC 33.0 31.8 - 35.4 g/dL   RDW, POC 16.1 %   Platelet Count, POC 289 142 - 424 K/uL   MPV 8.3 0 - 99.8 fL        Assessment & Plan:   Joanna Mcintosh is a 54 y.o. female Great toe pain, right - Plan: DG Toe Great Right, POCT CBC, Uric Acid, HYDROcodone-acetaminophen (NORCO/VICODIN) 5-325 MG per tablet, colchicine 0.6 MG tablet, DISCONTINUED: colchicine 0.6 MG tablet  Pain and swelling of toe, right - Plan: DG Toe Great Right, POCT CBC, Uric Acid, HYDROcodone-acetaminophen (NORCO/VICODIN) 5-325 MG per tablet  History of gout - Plan: DG Toe Great Right, POCT CBC, Uric Acid, colchicine 0.6 MG tablet, DISCONTINUED: colchicine 0.6 MG tablet  Abrasion of toe of right foot, initial encounter - Plan: DG Toe Great Right, POCT CBC, Uric Acid, doxycycline (VIBRA-TABS) 100 MG tablet, DISCONTINUED: doxycycline (VIBRA-TABS) 100 MG tablet  Abrasion in area of redness -possible cellulitis, but more likely flare of gout.   -cover with doxycycline, start colcrys - sed, and hydrocodone if needed  -check uric acid, handout on gout given. May need to look at BP regimen - specifically HCTZ as may increase gout flares.   -rtc precautions discussed.   HTN - uncontrolled off clonidine today - compliance, especially with this agent discussed as rebound HTN. Better control at home on regimen.   Restart clonidine and follow up with pcp.   Anemia - chronic by hx - follow up with PCP/OBGYN.    Meds ordered this encounter  Medications  . HYDROcodone-acetaminophen (NORCO/VICODIN) 5-325 MG per tablet    Sig: Take 1 tablet by mouth every 6 (six) hours as needed for moderate pain.    Dispense:  15 tablet    Refill:  0  . DISCONTD: colchicine 0.6 MG tablet    Sig: Take 2 tabs once, then 1 tab in 1 hour once if needed for current flare. No further doses.    Dispense:  30 tablet    Refill:  0  . DISCONTD: doxycycline (VIBRA-TABS) 100 MG tablet    Sig: Take 1 tablet (100 mg total) by mouth 2 (two) times daily.    Dispense:  20 tablet    Refill:  0  . colchicine 0.6 MG tablet    Sig: Take 2 tabs once, then 1 tab in 1 hour once if needed for current flare. No further doses.    Dispense:  30 tablet    Refill:  0  . doxycycline (VIBRA-TABS) 100 MG tablet    Sig: Take 1 tablet (100 mg total) by mouth 2 (two) times daily.    Dispense:  20 tablet    Refill:  0   Patient Instructions  Start colchicine - 3 total doses for this flare. Hydrocodone if needed for pain and doxycycline incase there is a small infection on the skin around the abrasion. You should receive a call or letter about your lab results within the next week to 10 days (gout blood test).  Return to the clinic or go to the nearest emergency room if any of your symptoms worsen or new symptoms occur.  Gout Gout is an inflammatory arthritis caused by a buildup of uric acid crystals in the joints. Uric acid is a chemical that is normally present in the blood. When the level of uric acid in the blood is too high it can  form crystals that deposit in your joints and tissues. This causes joint redness, soreness, and swelling (inflammation). Repeat attacks are common. Over time, uric acid crystals can form into masses (tophi) near a joint, destroying bone and causing disfigurement. Gout is treatable and often preventable. CAUSES  The  disease begins with elevated levels of uric acid in the blood. Uric acid is produced by your body when it breaks down a naturally found substance called purines. Certain foods you eat, such as meats and fish, contain high amounts of purines. Causes of an elevated uric acid level include:  Being passed down from parent to child (heredity).  Diseases that cause increased uric acid production (such as obesity, psoriasis, and certain cancers).  Excessive alcohol use.  Diet, especially diets rich in meat and seafood.  Medicines, including certain cancer-fighting medicines (chemotherapy), water pills (diuretics), and aspirin.  Chronic kidney disease. The kidneys are no longer able to remove uric acid well.  Problems with metabolism. Conditions strongly associated with gout include:  Obesity.  High blood pressure.  High cholesterol.  Diabetes. Not everyone with elevated uric acid levels gets gout. It is not understood why some people get gout and others do not. Surgery, joint injury, and eating too much of certain foods are some of the factors that can lead to gout attacks. SYMPTOMS   An attack of gout comes on quickly. It causes intense pain with redness, swelling, and warmth in a joint.  Fever can occur.  Often, only one joint is involved. Certain joints are more commonly involved:  Base of the big toe.  Knee.  Ankle.  Wrist.  Finger. Without treatment, an attack usually goes away in a few days to weeks. Between attacks, you usually will not have symptoms, which is different from many other forms of arthritis. DIAGNOSIS  Your caregiver will suspect gout based on your symptoms and exam. In some cases, tests may be recommended. The tests may include:  Blood tests.  Urine tests.  X-rays.  Joint fluid exam. This exam requires a needle to remove fluid from the joint (arthrocentesis). Using a microscope, gout is confirmed when uric acid crystals are seen in the joint  fluid. TREATMENT  There are two phases to gout treatment: treating the sudden onset (acute) attack and preventing attacks (prophylaxis).  Treatment of an Acute Attack.  Medicines are used. These include anti-inflammatory medicines or steroid medicines.  An injection of steroid medicine into the affected joint is sometimes necessary.  The painful joint is rested. Movement can worsen the arthritis.  You may use warm or cold treatments on painful joints, depending which works best for you.  Treatment to Prevent Attacks.  If you suffer from frequent gout attacks, your caregiver may advise preventive medicine. These medicines are started after the acute attack subsides. These medicines either help your kidneys eliminate uric acid from your body or decrease your uric acid production. You may need to stay on these medicines for a very long time.  The early phase of treatment with preventive medicine can be associated with an increase in acute gout attacks. For this reason, during the first few months of treatment, your caregiver may also advise you to take medicines usually used for acute gout treatment. Be sure you understand your caregiver's directions. Your caregiver may make several adjustments to your medicine dose before these medicines are effective.  Discuss dietary treatment with your caregiver or dietitian. Alcohol and drinks high in sugar and fructose and foods such as meat, poultry,  and seafood can increase uric acid levels. Your caregiver or dietitian can advise you on drinks and foods that should be limited. HOME CARE INSTRUCTIONS   Do not take aspirin to relieve pain. This raises uric acid levels.  Only take over-the-counter or prescription medicines for pain, discomfort, or fever as directed by your caregiver.  Rest the joint as much as possible. When in bed, keep sheets and blankets off painful areas.  Keep the affected joint raised (elevated).  Apply warm or cold treatments  to painful joints. Use of warm or cold treatments depends on which works best for you.  Use crutches if the painful joint is in your leg.  Drink enough fluids to keep your urine clear or pale yellow. This helps your body get rid of uric acid. Limit alcohol, sugary drinks, and fructose drinks.  Follow your dietary instructions. Pay careful attention to the amount of protein you eat. Your daily diet should emphasize fruits, vegetables, whole grains, and fat-free or low-fat milk products. Discuss the use of coffee, vitamin C, and cherries with your caregiver or dietitian. These may be helpful in lowering uric acid levels.  Maintain a healthy body weight. SEEK MEDICAL CARE IF:   You develop diarrhea, vomiting, or any side effects from medicines.  You do not feel better in 24 hours, or you are getting worse. SEEK IMMEDIATE MEDICAL CARE IF:   Your joint becomes suddenly more tender, and you have chills or a fever. MAKE SURE YOU:   Understand these instructions.  Will watch your condition.  Will get help right away if you are not doing well or get worse. Document Released: 02/08/2000 Document Revised: 06/27/2013 Document Reviewed: 09/24/2011 [facility] Patient Information 2015 [place], [place]. This information is not intended to replace advice given to you by your health care provider. Make sure you discuss any questions you have with your health care provider.       I personally performed the services described in this documentation, which was scribed in my presence. The recorded information has been reviewed and considered, and addended by me as needed.

## 2014-05-18 NOTE — Telephone Encounter (Signed)
Patient called because Colchicine was too expensive. It was 300 and some dollars because insurance would not cover, and would not accept coupon. She is requesting medication that was prescribed at her last OV be called in to pharmacy. She could not remember name of medication. Review[facility] to [facility] and she confirmed. She was prescribed Indomethacin and Hydrocodone so she was referring to Indomethacin. Spoke to Dr. Neva SeatGreene and he discussed and explained during her visit why he did not prescribe Indomethacin at this visit. The reason he did not was because of her BP. BP was 180/110 in office today. He did not want to give her NSAID due to BP. With it being that high it could cause it to go higher or worse such as heart failure. Per Dr. Neva SeatGreene she can take her BP medication this evening and as long as her BP goes down she can start Indomethacin. Patient notified and voiced understanding. Since she has not started taking hydrocodone yet once she got home take it so that way it might help take the edge off of her pain while waiting to start Indomethacin.

## 2014-05-23 ENCOUNTER — Ambulatory Visit: Payer: Self-pay | Admitting: Obstetrics & Gynecology
